# Patient Record
Sex: Female | Born: 1970
Health system: Southern US, Community
[De-identification: ages and names within clinical notes are randomized; demographics above are authoritative.]

## PROBLEM LIST (undated history)

## (undated) DIAGNOSIS — Z9229 Personal history of other drug therapy: Secondary | ICD-10-CM

## (undated) DIAGNOSIS — N83209 Unspecified ovarian cyst, unspecified side: Secondary | ICD-10-CM

## (undated) DIAGNOSIS — E785 Hyperlipidemia, unspecified: Secondary | ICD-10-CM

## (undated) DIAGNOSIS — K7689 Other specified diseases of liver: Secondary | ICD-10-CM

## (undated) HISTORY — PX: ABDOMINAL HYSTERECTOMY: SHX81

## (undated) HISTORY — DX: Personal history of other drug therapy: Z92.29

## (undated) HISTORY — PX: OVARIAN CYST REMOVAL: SHX89

## (undated) HISTORY — DX: Unspecified ovarian cyst, unspecified side: N83.209

---

## 1994-03-27 DIAGNOSIS — E785 Hyperlipidemia, unspecified: Secondary | ICD-10-CM

## 1994-03-27 HISTORY — DX: Hyperlipidemia, unspecified: E78.5

## 1998-03-27 HISTORY — PX: BREAST SURGERY: SHX581

## 2002-03-27 HISTORY — PX: TUBAL LIGATION: SHX77

## 2010-03-27 DIAGNOSIS — Z9229 Personal history of other drug therapy: Secondary | ICD-10-CM

## 2010-03-27 HISTORY — DX: Personal history of other drug therapy: Z92.29

## 2012-01-20 ENCOUNTER — Encounter (HOSPITAL_COMMUNITY): Payer: Self-pay | Admitting: Physical Medicine and Rehabilitation

## 2012-01-20 ENCOUNTER — Emergency Department (HOSPITAL_COMMUNITY)
Admission: EM | Admit: 2012-01-20 | Discharge: 2012-01-20 | Disposition: A | Discharge status: Home / Self Care | Payer: Self-pay | Attending: Emergency Medicine | Admitting: Emergency Medicine

## 2012-01-20 DIAGNOSIS — T148XXA Other injury of unspecified body region, initial encounter: Secondary | ICD-10-CM | POA: Insufficient documentation

## 2012-01-20 DIAGNOSIS — Y990 Civilian activity done for income or pay: Secondary | ICD-10-CM | POA: Insufficient documentation

## 2012-01-20 DIAGNOSIS — X500XXA Overexertion from strenuous movement or load, initial encounter: Secondary | ICD-10-CM | POA: Insufficient documentation

## 2012-01-20 DIAGNOSIS — Y9389 Activity, other specified: Secondary | ICD-10-CM | POA: Insufficient documentation

## 2012-01-20 DIAGNOSIS — Y9269 Other specified industrial and construction area as the place of occurrence of the external cause: Secondary | ICD-10-CM | POA: Insufficient documentation

## 2012-01-20 DIAGNOSIS — M25519 Pain in unspecified shoulder: Secondary | ICD-10-CM | POA: Insufficient documentation

## 2012-01-20 DIAGNOSIS — E785 Hyperlipidemia, unspecified: Secondary | ICD-10-CM | POA: Insufficient documentation

## 2012-01-20 HISTORY — DX: Hyperlipidemia, unspecified: E78.5

## 2012-01-20 MED ORDER — CYCLOBENZAPRINE HCL 10 MG PO TABS: 10.0000 mg | ORAL_TABLET | Freq: Three times a day (TID) | ORAL | Status: DC | PRN

## 2012-01-20 MED ORDER — DIAZEPAM 5 MG PO TABS
5.0000 mg | ORAL_TABLET | Freq: Once | ORAL | Status: AC
  Administered 2012-01-20: 5 mg via ORAL
  Filled 2012-01-20: qty 1

## 2012-01-20 MED ORDER — KETOROLAC TROMETHAMINE 60 MG/2ML IM SOLN
60.0000 mg | Freq: Once | INTRAMUSCULAR | Status: AC
  Administered 2012-01-20: 60 mg via INTRAMUSCULAR
  Filled 2012-01-20: qty 2

## 2012-01-20 MED ORDER — IBUPROFEN 800 MG PO TABS: 800.0000 mg | ORAL_TABLET | Freq: Three times a day (TID) | ORAL | Status: DC

## 2012-01-20 NOTE — ED Notes (Signed)
Pt presents to department for evaluation of L shoulder, back and neck pain. Ongoing x3 days. States "I think I pulled a muscle at work." States she lifts heavy objects frequently. 7/10 pain, becomes worse with movement. Full ROM noted. She is alert and oriented x4.

## 2012-01-20 NOTE — ED Notes (Signed)
Pt states the pain began three days ago on her upper left back and shoulder area and he her neck. She has tried taken aleve and a bengay patch but nothing has worked. States pain is a 9/10.

## 2012-01-20 NOTE — ED Provider Notes (Signed)
History     CSN: [id number]  Arrival date & time 01/20/12  4098   First MD Initiated Contact with Patient 01/20/12 1004      Chief Complaint  Patient presents with  . Back Pain  . Shoulder Pain    (Consider location/radiation/quality/duration/timing/severity/associated sxs/prior treatment) HPI Comments: Patient is a 41 year old female who presents with a 3 days history of left shoulder, upper left back and neck pain. The pain started gradually and progressively worsened since the onset. 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 The pain is described as a "soreness" that is moderate in severity and made worse with movement. Patient has tried heat for pain without significant relief. No alleviating factors. Patient denies weakness, joint swelling, numbness/tingling, coolness, color changes of affected extremities. Patient denies fever, chills, NVD, chest pain, SOB, abdominal pain, low back pain.    Past Medical History  Diagnosis Date  . Hyperlipemia     No past surgical history on file.  No family history on file.  History  Substance Use Topics  . Smoking status: Never Smoker   . Smokeless tobacco: Not on file  . Alcohol Use: No    OB History    Grav Para Term Preterm Abortions TAB SAB Ect Mult Living                  Review of Systems  Musculoskeletal: Positive for back pain and arthralgias.  All other systems reviewed and are negative.    Allergies  Review of patient's allergies indicates no known allergies.  Home Medications   Current Outpatient Rx  Name Route Sig Dispense Refill  . NAPROXEN SODIUM 220 MG PO TABS Oral Take 440-660 mg by mouth 2 (two) times daily as needed. For pain    . CYCLOBENZAPRINE HCL 10 MG PO TABS Oral Take 1 tablet (10 mg total) by mouth 3 (three) times daily as needed for muscle spasms. 30 tablet 0  . IBUPROFEN 800 MG PO TABS Oral Take 1 tablet (800 mg total) by mouth 3 (three) times daily. 21  tablet 0    BP 115/52  Pulse 63  Temp 98 F (36.7 C) (Oral)  Resp 18  SpO2 100%  Physical Exam  Nursing note and vitals reviewed. Constitutional: She is oriented to person, place, and time. She appears well-developed and well-nourished. No distress.  HENT:  Head: Normocephalic and atraumatic.  Eyes: Conjunctivae normal are normal.  Neck: Normal range of motion. Neck supple.       See musculoskeletal  Cardiovascular: Normal rate, regular rhythm and intact distal pulses.  Exam reveals no gallop and no friction rub.   No murmur heard.      Sufficient capillary refill of distal upper extremities.   Pulmonary/Chest: Effort normal and breath sounds normal. She has no wheezes. She has no rales. She exhibits no tenderness.  Abdominal: Soft. She exhibits no distension.  Musculoskeletal: Normal range of motion.       Left deltoid, left lateral neck and left scapular area tender to palpation. Pain elicited in those areas with movement of the left shoulder in all direction. No bruising, swelling, or obvious deformity noted.   Neurological: She is alert and oriented to person, place, and time. Coordination normal.       Strength and sensation equal and intact of upper extremities. Speech is goal-oriented. Moves limbs without ataxia.   Skin: Skin is warm and dry. She is not diaphoretic.  No bruising noted.   Psychiatric: She has a normal mood and affect. Her behavior is normal.    ED Course  Procedures (including critical care time)  Labs Reviewed - No data to display No results found.   1. Muscle strain       MDM  Patient likely has musculoskeletal pain related to overuse. She moves heavy objects at work. No localized bony tenderness. No need for imaging.         Emilia Beck, [place] 01/31/12 [phone]

## 2012-01-20 NOTE — ED Notes (Signed)
Discharge instructions reviewed. Pt verbalized understanding.  

## 2012-02-03 NOTE — ED Provider Notes (Signed)
Medical screening examination/treatment/procedure(s) were performed by non-physician practitioner and as supervising physician I was immediately available for consultation/collaboration.   Gerhard Munch, MD 02/03/12 [phone]

## 2012-03-10 ENCOUNTER — Emergency Department (HOSPITAL_BASED_OUTPATIENT_CLINIC_OR_DEPARTMENT_OTHER): Payer: Self-pay

## 2012-03-10 ENCOUNTER — Emergency Department (HOSPITAL_BASED_OUTPATIENT_CLINIC_OR_DEPARTMENT_OTHER)
Admission: EM | Admit: 2012-03-10 | Discharge: 2012-03-10 | Disposition: A | Discharge status: Home / Self Care | Payer: Self-pay | Attending: Emergency Medicine | Admitting: Emergency Medicine

## 2012-03-10 ENCOUNTER — Encounter (HOSPITAL_BASED_OUTPATIENT_CLINIC_OR_DEPARTMENT_OTHER): Payer: Self-pay | Admitting: *Deleted

## 2012-03-10 DIAGNOSIS — Z862 Personal history of diseases of the blood and blood-forming organs and certain disorders involving the immune mechanism: Secondary | ICD-10-CM | POA: Insufficient documentation

## 2012-03-10 DIAGNOSIS — M549 Dorsalgia, unspecified: Secondary | ICD-10-CM

## 2012-03-10 DIAGNOSIS — Z791 Long term (current) use of non-steroidal anti-inflammatories (NSAID): Secondary | ICD-10-CM | POA: Insufficient documentation

## 2012-03-10 DIAGNOSIS — Z79899 Other long term (current) drug therapy: Secondary | ICD-10-CM | POA: Insufficient documentation

## 2012-03-10 DIAGNOSIS — IMO0001 Reserved for inherently not codable concepts without codable children: Secondary | ICD-10-CM | POA: Insufficient documentation

## 2012-03-10 DIAGNOSIS — M545 Low back pain, unspecified: Secondary | ICD-10-CM | POA: Insufficient documentation

## 2012-03-10 DIAGNOSIS — Z8639 Personal history of other endocrine, nutritional and metabolic disease: Secondary | ICD-10-CM | POA: Insufficient documentation

## 2012-03-10 DIAGNOSIS — Z3202 Encounter for pregnancy test, result negative: Secondary | ICD-10-CM | POA: Insufficient documentation

## 2012-03-10 LAB — URINE MICROSCOPIC-ADD ON

## 2012-03-10 LAB — URINALYSIS, ROUTINE W REFLEX MICROSCOPIC
Leukocytes, UA: NEGATIVE
Nitrite: NEGATIVE
Specific Gravity, Urine: 1.015 (ref 1.005–1.030)
Urobilinogen, UA: 0.2 mg/dL (ref 0.0–1.0)
pH: 5 (ref 5.0–8.0)

## 2012-03-10 MED ORDER — CYCLOBENZAPRINE HCL 10 MG PO TABS
10.0000 mg | ORAL_TABLET | Freq: Once | ORAL | Status: AC
  Administered 2012-03-10: 10 mg via ORAL
  Filled 2012-03-10: qty 1

## 2012-03-10 MED ORDER — CYCLOBENZAPRINE HCL 10 MG PO TABS: 10.0000 mg | ORAL_TABLET | Freq: Two times a day (BID) | ORAL | Status: DC | PRN

## 2012-03-10 MED ORDER — HYDROCODONE-ACETAMINOPHEN 5-500 MG PO TABS: 1.0000 | ORAL_TABLET | Freq: Four times a day (QID) | ORAL | Status: DC | PRN

## 2012-03-10 MED ORDER — OXYCODONE-ACETAMINOPHEN 5-325 MG PO TABS
2.0000 | ORAL_TABLET | Freq: Once | ORAL | Status: AC
  Administered 2012-03-10: 2 via ORAL
  Filled 2012-03-10 (×2): qty 2

## 2012-03-10 NOTE — ED Notes (Signed)
Pt states she bent down and has been hurting in her lower back for about a week. Hurts to walk, bend, move, etc

## 2012-03-10 NOTE — ED Provider Notes (Signed)
History     CSN: [id number]  Arrival date & time 03/10/12  1503   First MD Initiated Contact with Patient 03/10/12 1714      Chief Complaint  Patient presents with  . Back Pain    (Consider location/radiation/quality/duration/timing/severity/associated sxs/prior treatment) HPI Comments: Patient presents with back pain X 1 week. She states the pain is in her lower back with some radiation down her right leg. Pain is worse with bending and movement. Of note patient has had similar back pain and seen in ED for same. Has tried Bengay patches with minimal improvement. Denies fever or chills. Denies NVD or abdominal pain. Denies dysuria, frequency, or abdominal pain.   The history is provided by the patient. No language interpreter was used.    Past Medical History  Diagnosis Date  . Hyperlipemia     Past Surgical History  Procedure Date  . Breast surgery   . Ovarian cyst removal     No family history on file.  History  Substance Use Topics  . Smoking status: Never Smoker   . Smokeless tobacco: Not on file  . Alcohol Use: No    OB History    Grav Para Term Preterm Abortions TAB SAB Ect Mult Living                  Review of Systems  Constitutional: Negative for fever and chills.  Gastrointestinal: Negative for nausea, vomiting, abdominal pain and diarrhea.  Genitourinary: Negative for dysuria, urgency, frequency and difficulty urinating.  Musculoskeletal: Positive for myalgias and back pain.    Allergies  Review of patient's allergies indicates no known allergies.  Home Medications   Current Outpatient Rx  Name  Route  Sig  Dispense  Refill  . CYCLOBENZAPRINE HCL 10 MG PO TABS   Oral   Take 1 tablet (10 mg total) by mouth 3 (three) times daily as needed for muscle spasms.   30 tablet   0   . IBUPROFEN 800 MG PO TABS   Oral   Take 1 tablet (800 mg total) by mouth 3 (three) times daily.   21 tablet   0   . NAPROXEN SODIUM 220 MG PO TABS   Oral   Take  440-660 mg by mouth 2 (two) times daily as needed. For pain           BP 119/62  Pulse 84  Temp 98.8 F (37.1 C) (Oral)  Resp 16  Ht 5\' 7"  (1.702 m)  Wt 145 lb (65.772 kg)  BMI 22.71 kg/m2  SpO2 100%  LMP 02/19/2012  Physical Exam  Nursing note and vitals reviewed. Constitutional: She is oriented to person, place, and time. She appears well-developed and well-nourished.  HENT:  Head: Normocephalic and atraumatic.  Mouth/Throat: Oropharynx is clear and moist.  Eyes: Conjunctivae normal and EOM are normal. No scleral icterus.  Neck: Normal range of motion. Neck supple.  Cardiovascular: Normal rate, regular rhythm and normal heart sounds.   Pulmonary/Chest: Effort normal and breath sounds normal.  Abdominal: Soft. Bowel sounds are normal. There is no tenderness.  Musculoskeletal: Normal range of motion. She exhibits tenderness. She exhibits no edema.       Pain with flexion. Tenderness to palpation of the right lumbosacral paraspinal muscles.   Neurological: She is alert and oriented to person, place, and time. She has normal reflexes. She displays normal reflexes. She exhibits normal muscle tone. Coordination normal.  Skin: Skin is warm and dry.    ED  Course  Procedures (including critical care time)  Labs Reviewed  URINALYSIS, ROUTINE W REFLEX MICROSCOPIC - Abnormal; Notable for the following:    APPearance CLOUDY (*)     Hgb urine dipstick TRACE (*)     All other components within normal limits  PREGNANCY, URINE  URINE MICROSCOPIC-ADD ON   Results for orders placed during the hospital encounter of 03/10/12  URINALYSIS, ROUTINE W REFLEX MICROSCOPIC      Component Value Range   Color, Urine YELLOW  YELLOW   APPearance CLOUDY (*) CLEAR   Specific Gravity, Urine 1.015  1.005 - 1.030   pH 5.0  5.0 - 8.0   Glucose, UA NEGATIVE  NEGATIVE mg/dL   Hgb urine dipstick TRACE (*) NEGATIVE   Bilirubin Urine NEGATIVE  NEGATIVE   Ketones, ur NEGATIVE  NEGATIVE mg/dL   Protein,  ur NEGATIVE  NEGATIVE mg/dL   Urobilinogen, UA 0.2  0.0 - 1.0 mg/dL   Nitrite NEGATIVE  NEGATIVE   Leukocytes, UA NEGATIVE  NEGATIVE  PREGNANCY, URINE      Component Value Range   Preg Test, Ur NEGATIVE  NEGATIVE  URINE MICROSCOPIC-ADD ON      Component Value Range   RBC / HPF 0-2  <3 RBC/hpf    Dg Lumbar Spine Complete  03/10/2012  *RADIOLOGY REPORT*  Clinical Data: Low back pain  LUMBAR SPINE - COMPLETE 4+ VIEW  Comparison: None.  Findings: Five lumbar-type vertebral bodies.  Normal lumbar lordosis.  No evidence of fracture or dislocation.  Vertebral body heights and intervertebral disc spaces are maintained.  Visualized bony pelvis appears intact.  IMPRESSION: Normal lumbar spine radiographs.   Original Report Authenticated By: Charline Bills, M.D.      1. Back pain       MDM  Patient presented with complaint of low back pain. Imaging unremarkable. Given percocet and flexeril with some improvement. Discharged on same. Referred to orthopedic specialist for MRI if desired. No neuro or strength deficits. Discharged with return precautions. No red flags for cauda equina.       Pixie Casino, PA-C 03/10/12 1922  Pixie Casino, [place] 03/10/12 [phone]

## 2012-03-11 NOTE — ED Provider Notes (Signed)
Medical screening examination/treatment/procedure(s) were performed by non-physician practitioner and as supervising physician I was immediately available for consultation/collaboration.  Shelda Jakes, MD 03/11/12 1758

## 2012-08-06 ENCOUNTER — Encounter (HOSPITAL_BASED_OUTPATIENT_CLINIC_OR_DEPARTMENT_OTHER): Payer: Self-pay

## 2012-08-06 ENCOUNTER — Emergency Department (HOSPITAL_BASED_OUTPATIENT_CLINIC_OR_DEPARTMENT_OTHER)
Admission: EM | Admit: 2012-08-06 | Discharge: 2012-08-06 | Disposition: A | Discharge status: Home / Self Care | Payer: Worker's Compensation | Attending: Emergency Medicine | Admitting: Emergency Medicine

## 2012-08-06 DIAGNOSIS — W260XXA Contact with knife, initial encounter: Secondary | ICD-10-CM | POA: Insufficient documentation

## 2012-08-06 DIAGNOSIS — S61012A Laceration without foreign body of left thumb without damage to nail, initial encounter: Secondary | ICD-10-CM

## 2012-08-06 DIAGNOSIS — Y9289 Other specified places as the place of occurrence of the external cause: Secondary | ICD-10-CM | POA: Insufficient documentation

## 2012-08-06 DIAGNOSIS — Z862 Personal history of diseases of the blood and blood-forming organs and certain disorders involving the immune mechanism: Secondary | ICD-10-CM | POA: Insufficient documentation

## 2012-08-06 DIAGNOSIS — Z8639 Personal history of other endocrine, nutritional and metabolic disease: Secondary | ICD-10-CM | POA: Insufficient documentation

## 2012-08-06 DIAGNOSIS — W261XXA Contact with sword or dagger, initial encounter: Secondary | ICD-10-CM | POA: Insufficient documentation

## 2012-08-06 DIAGNOSIS — Y990 Civilian activity done for income or pay: Secondary | ICD-10-CM | POA: Insufficient documentation

## 2012-08-06 DIAGNOSIS — S61209A Unspecified open wound of unspecified finger without damage to nail, initial encounter: Secondary | ICD-10-CM | POA: Insufficient documentation

## 2012-08-06 DIAGNOSIS — Z79899 Other long term (current) drug therapy: Secondary | ICD-10-CM | POA: Insufficient documentation

## 2012-08-06 DIAGNOSIS — Y9389 Activity, other specified: Secondary | ICD-10-CM | POA: Insufficient documentation

## 2012-08-06 MED ORDER — LIDOCAINE HCL 2 % IJ SOLN
INTRAMUSCULAR | Status: AC
  Administered 2012-08-06: 11:00:00
  Filled 2012-08-06: qty 20

## 2012-08-06 MED ORDER — OXYCODONE-ACETAMINOPHEN 5-325 MG PO TABS: 2.0000 | ORAL_TABLET | ORAL | Status: DC | PRN

## 2012-08-06 NOTE — ED Notes (Signed)
Laceration to left thumb

## 2012-08-06 NOTE — ED Notes (Signed)
Pt was using a "razor knife" at work cut left thumb bleeding contolled on arrival

## 2012-08-06 NOTE — ED Notes (Signed)
MD at bedside. 

## 2012-08-06 NOTE — ED Provider Notes (Signed)
History     CSN: [id number]  Arrival date & time 08/06/12  1012   First MD Initiated Contact with Patient 08/06/12 1025      Chief Complaint  Patient presents with  . Laceration    (Consider location/radiation/quality/duration/timing/severity/associated sxs/prior treatment) HPI Comments: Cut left thumb with a utility knife at work.  Patient is a 42 y.o. female presenting with skin laceration. The history is provided by the patient.  Laceration Location: left thumb. Depth:  Through underlying tissue Bleeding: controlled   Time since incident:  1 hour Laceration mechanism:  Knife Pain details:    Quality:  Aching   Severity:  Severe   Timing:  Constant   Progression:  Worsening Foreign body present:  No foreign bodies Relieved by:  Nothing Worsened by:  Nothing tried Ineffective treatments:  None tried Tetanus status:  Up to date   Past Medical History  Diagnosis Date  . Hyperlipemia     Past Surgical History  Procedure Laterality Date  . Breast surgery    . Ovarian cyst removal      No family history on file.  History  Substance Use Topics  . Smoking status: Never Smoker   . Smokeless tobacco: Not on file  . Alcohol Use: No    OB History   Grav Para Term Preterm Abortions TAB SAB Ect Mult Living                  Review of Systems  All other systems reviewed and are negative.    Allergies  Review of patient's allergies indicates no known allergies.  Home Medications   Current Outpatient Rx  Name  Route  Sig  Dispense  Refill  . cyclobenzaprine (FLEXERIL) 10 MG tablet   Oral   Take 1 tablet (10 mg total) by mouth 3 (three) times daily as needed for muscle spasms.   30 tablet   0   . cyclobenzaprine (FLEXERIL) 10 MG tablet   Oral   Take 1 tablet (10 mg total) by mouth 2 (two) times daily as needed for muscle spasms.   20 tablet   0   . HYDROcodone-acetaminophen (VICODIN) 5-500 MG per tablet   Oral   Take 1-2 tablets by mouth every 6  (six) hours as needed for pain.   15 tablet   0   . ibuprofen (ADVIL,MOTRIN) 800 MG tablet   Oral   Take 1 tablet (800 mg total) by mouth 3 (three) times daily.   21 tablet   0   . naproxen sodium (ANAPROX) 220 MG tablet   Oral   Take 440-660 mg by mouth 2 (two) times daily as needed. For pain           BP 130/77  Pulse 90  Temp(Src) 98.5 F (36.9 C) (Oral)  Resp 22  SpO2 99%  Physical Exam  Nursing note and vitals reviewed. Constitutional: She is oriented to person, place, and time. She appears well-developed and well-nourished.  HENT:  Head: Normocephalic and atraumatic.  Neck: Normal range of motion. Neck supple.  Musculoskeletal:  The left thumb is noted to have a laceration with longitudinal  flap to the medial aspect of the nail.  There is no nail involvement.    Neurological: She is alert and oriented to person, place, and time.  Skin: Skin is warm and dry.    ED Course  Procedures (including critical care time)  Labs Reviewed - No data to display No results found.  No diagnosis found.  LACERATION REPAIR Performed by: Geoffery Lyons Authorized by: Geoffery Lyons Consent: Verbal consent obtained. Risks and benefits: risks, benefits and alternatives were discussed Consent given by: patient Patient identity confirmed: provided demographic data Prepped and Draped in normal sterile fashion Wound explored  Laceration Location: left thumb  Laceration Length: 2.5 cm  No Foreign Bodies seen or palpated  Anesthesia: digital block  Local anesthetic: lidocaine 2% without epinephrine  Anesthetic total: 7 ml  Irrigation method: syringe Amount of cleaning: standard  Skin closure: 4-0 prolene  Number of sutures: 4  Technique: simple interrupted.  Patient tolerance: Patient tolerated the procedure well with no immediate complications.   MDM  Local wound care.  Sutures out in one week.          Geoffery Lyons, MD 08/06/12 1049

## 2013-10-09 ENCOUNTER — Observation Stay (HOSPITAL_BASED_OUTPATIENT_CLINIC_OR_DEPARTMENT_OTHER)
Admission: EM | Admit: 2013-10-09 | Discharge: 2013-10-11 | Disposition: A | Discharge status: Home / Self Care | Payer: Medicaid Other | Attending: Internal Medicine | Admitting: Internal Medicine

## 2013-10-09 ENCOUNTER — Observation Stay (HOSPITAL_COMMUNITY): Payer: Medicaid Other

## 2013-10-09 ENCOUNTER — Emergency Department (HOSPITAL_BASED_OUTPATIENT_CLINIC_OR_DEPARTMENT_OTHER): Payer: Medicaid Other

## 2013-10-09 ENCOUNTER — Encounter (HOSPITAL_BASED_OUTPATIENT_CLINIC_OR_DEPARTMENT_OTHER): Payer: Self-pay | Admitting: Emergency Medicine

## 2013-10-09 DIAGNOSIS — R079 Chest pain, unspecified: Principal | ICD-10-CM | POA: Insufficient documentation

## 2013-10-09 DIAGNOSIS — E785 Hyperlipidemia, unspecified: Secondary | ICD-10-CM | POA: Insufficient documentation

## 2013-10-09 DIAGNOSIS — T7840XA Allergy, unspecified, initial encounter: Secondary | ICD-10-CM

## 2013-10-09 DIAGNOSIS — R0602 Shortness of breath: Secondary | ICD-10-CM

## 2013-10-09 DIAGNOSIS — R002 Palpitations: Secondary | ICD-10-CM | POA: Diagnosis present

## 2013-10-09 DIAGNOSIS — K769 Liver disease, unspecified: Secondary | ICD-10-CM | POA: Diagnosis present

## 2013-10-09 DIAGNOSIS — R220 Localized swelling, mass and lump, head: Secondary | ICD-10-CM

## 2013-10-09 DIAGNOSIS — R0789 Other chest pain: Secondary | ICD-10-CM

## 2013-10-09 DIAGNOSIS — T7840XD Allergy, unspecified, subsequent encounter: Secondary | ICD-10-CM

## 2013-10-09 LAB — CBC WITH DIFFERENTIAL/PLATELET
Basophils Absolute: 0 10*3/uL (ref 0.0–0.1)
Basophils Relative: 0 % (ref 0–1)
EOS ABS: 1.8 10*3/uL — AB (ref 0.0–0.7)
Eosinophils Relative: 21 % — ABNORMAL HIGH (ref 0–5)
HEMATOCRIT: 32.7 % — AB (ref 36.0–46.0)
HEMOGLOBIN: 10.2 g/dL — AB (ref 12.0–15.0)
LYMPHS ABS: 3 10*3/uL (ref 0.7–4.0)
Lymphocytes Relative: 35 % (ref 12–46)
MCH: 23.2 pg — AB (ref 26.0–34.0)
MCHC: 31.2 g/dL (ref 30.0–36.0)
MCV: 74.3 fL — AB (ref 78.0–100.0)
MONOS PCT: 6 % (ref 3–12)
Monocytes Absolute: 0.5 10*3/uL (ref 0.1–1.0)
NEUTROS PCT: 38 % — AB (ref 43–77)
Neutro Abs: 3.3 10*3/uL (ref 1.7–7.7)
Platelets: 289 10*3/uL (ref 150–400)
RBC: 4.4 MIL/uL (ref 3.87–5.11)
RDW: 16.9 % — ABNORMAL HIGH (ref 11.5–15.5)
WBC: 8.7 10*3/uL (ref 4.0–10.5)

## 2013-10-09 LAB — BASIC METABOLIC PANEL
Anion gap: 13 (ref 5–15)
BUN: 9 mg/dL (ref 6–23)
CHLORIDE: 105 meq/L (ref 96–112)
CO2: 22 mEq/L (ref 19–32)
Calcium: 9.1 mg/dL (ref 8.4–10.5)
Creatinine, Ser: 0.6 mg/dL (ref 0.50–1.10)
GFR calc Af Amer: >90 mL/min (ref >90)
GLUCOSE: 106 mg/dL — AB (ref 70–99)
POTASSIUM: 4 meq/L (ref 3.7–5.3)
Sodium: 140 mEq/L (ref 137–147)

## 2013-10-09 LAB — LIPID PANEL
Cholesterol: 273 mg/dL — ABNORMAL HIGH (ref 0–200)
HDL: 35 mg/dL — ABNORMAL LOW (ref >39)
LDL CALC: 187 mg/dL — AB (ref 0–99)
TRIGLYCERIDES: 254 mg/dL — AB (ref <150)
Total CHOL/HDL Ratio: 7.8 RATIO
VLDL: 51 mg/dL — ABNORMAL HIGH (ref 0–40)

## 2013-10-09 LAB — HEPATIC FUNCTION PANEL
ALT: 12 U/L (ref 0–35)
AST: 18 U/L (ref 0–37)
Albumin: 3.9 g/dL (ref 3.5–5.2)
Alkaline Phosphatase: 75 U/L (ref 39–117)
Total Bilirubin: <0.2 mg/dL — ABNORMAL LOW (ref 0.3–1.2)
Total Protein: 7.5 g/dL (ref 6.0–8.3)

## 2013-10-09 LAB — CBC
HCT: 34.4 % — ABNORMAL LOW (ref 36.0–46.0)
HEMOGLOBIN: 10.5 g/dL — AB (ref 12.0–15.0)
MCH: 22.6 pg — ABNORMAL LOW (ref 26.0–34.0)
MCHC: 30.5 g/dL (ref 30.0–36.0)
MCV: 74 fL — ABNORMAL LOW (ref 78.0–100.0)
Platelets: 328 10*3/uL (ref 150–400)
RBC: 4.65 MIL/uL (ref 3.87–5.11)
RDW: 16.7 % — ABNORMAL HIGH (ref 11.5–15.5)
WBC: 9.1 10*3/uL (ref 4.0–10.5)

## 2013-10-09 LAB — LIPASE, BLOOD: Lipase: 29 U/L (ref 11–59)

## 2013-10-09 LAB — CREATININE, SERUM
CREATININE: 0.57 mg/dL (ref 0.50–1.10)
GFR calc Af Amer: >90 mL/min (ref >90)

## 2013-10-09 LAB — IRON AND TIBC
IRON: 17 ug/dL — AB (ref 42–135)
Saturation Ratios: 4 % — ABNORMAL LOW (ref 20–55)
TIBC: 457 ug/dL (ref 250–470)
UIBC: 440 ug/dL — ABNORMAL HIGH (ref 125–400)

## 2013-10-09 LAB — TSH: TSH: 2.63 u[IU]/mL (ref 0.350–4.500)

## 2013-10-09 LAB — FOLATE: FOLATE: 13.8 ng/mL

## 2013-10-09 LAB — TROPONIN I: Troponin I: <0.3 ng/mL (ref <0.30)

## 2013-10-09 LAB — FERRITIN: FERRITIN: 7 ng/mL — AB (ref 10–291)

## 2013-10-09 LAB — RETICULOCYTES
RBC.: 4.64 MIL/uL (ref 3.87–5.11)
RETIC COUNT ABSOLUTE: 37.1 10*3/uL (ref 19.0–186.0)
Retic Ct Pct: 0.8 % (ref 0.4–3.1)

## 2013-10-09 LAB — VITAMIN B12: Vitamin B-12: 435 pg/mL (ref 211–911)

## 2013-10-09 LAB — AMYLASE: Amylase: 44 U/L (ref 0–105)

## 2013-10-09 LAB — MRSA PCR SCREENING: MRSA by PCR: NEGATIVE

## 2013-10-09 LAB — MAGNESIUM: MAGNESIUM: 2.1 mg/dL (ref 1.5–2.5)

## 2013-10-09 LAB — PHOSPHORUS: Phosphorus: 3.3 mg/dL (ref 2.3–4.6)

## 2013-10-09 MED ORDER — MORPHINE SULFATE 2 MG/ML IJ SOLN
2.0000 mg | INTRAMUSCULAR | Status: DC | PRN
  Administered 2013-10-09: 2 mg via INTRAVENOUS
  Filled 2013-10-09: qty 1

## 2013-10-09 MED ORDER — ASPIRIN EC 325 MG PO TBEC
325.0000 mg | DELAYED_RELEASE_TABLET | Freq: Every day | ORAL | Status: DC
  Administered 2013-10-09 – 2013-10-11 (×3): 325 mg via ORAL
  Filled 2013-10-09 (×3): qty 1

## 2013-10-09 MED ORDER — HEPARIN SODIUM (PORCINE) 5000 UNIT/ML IJ SOLN
5000.0000 [IU] | Freq: Three times a day (TID) | INTRAMUSCULAR | Status: DC
  Administered 2013-10-09 – 2013-10-11 (×5): 5000 [IU] via SUBCUTANEOUS
  Filled 2013-10-09 (×9): qty 1

## 2013-10-09 MED ORDER — MORPHINE SULFATE 4 MG/ML IJ SOLN
4.0000 mg | Freq: Once | INTRAMUSCULAR | Status: AC
  Administered 2013-10-09: 4 mg via INTRAVENOUS
  Filled 2013-10-09: qty 1

## 2013-10-09 MED ORDER — ASPIRIN 81 MG PO CHEW
162.0000 mg | CHEWABLE_TABLET | Freq: Once | ORAL | Status: AC
  Administered 2013-10-09: 162 mg via ORAL
  Filled 2013-10-09: qty 2

## 2013-10-09 MED ORDER — ONDANSETRON HCL 4 MG/2ML IJ SOLN
4.0000 mg | Freq: Four times a day (QID) | INTRAMUSCULAR | Status: DC | PRN
  Administered 2013-10-10: 4 mg via INTRAVENOUS
  Filled 2013-10-09: qty 2

## 2013-10-09 MED ORDER — PANTOPRAZOLE SODIUM 40 MG PO TBEC
40.0000 mg | DELAYED_RELEASE_TABLET | Freq: Every day | ORAL | Status: DC
  Administered 2013-10-09 – 2013-10-11 (×3): 40 mg via ORAL
  Filled 2013-10-09 (×3): qty 1

## 2013-10-09 MED ORDER — NITROGLYCERIN 0.4 MG SL SUBL
0.4000 mg | SUBLINGUAL_TABLET | SUBLINGUAL | Status: DC | PRN
  Administered 2013-10-09: 0.4 mg via SUBLINGUAL
  Filled 2013-10-09: qty 1

## 2013-10-09 MED ORDER — ACETAMINOPHEN 325 MG PO TABS
650.0000 mg | ORAL_TABLET | ORAL | Status: DC | PRN
  Administered 2013-10-10 (×2): 650 mg via ORAL
  Filled 2013-10-09 (×2): qty 2

## 2013-10-09 MED ORDER — CYCLOBENZAPRINE HCL 10 MG PO TABS
10.0000 mg | ORAL_TABLET | Freq: Three times a day (TID) | ORAL | Status: DC | PRN
  Administered 2013-10-09: 10 mg via ORAL
  Filled 2013-10-09 (×2): qty 1

## 2013-10-09 NOTE — ED Notes (Signed)
Doug called back with Truck dispatched to carry patient to [facility]

## 2013-10-09 NOTE — ED Notes (Signed)
Pt reports chest tightness, (L) arm heaviness and pain and SOB, nausea-denies vomiting.

## 2013-10-09 NOTE — ED Provider Notes (Addendum)
CSN: [id number]     Arrival date & time 10/09/13  0449 History   First MD Initiated Contact with Patient 10/09/13 0505     Chief Complaint  Patient presents with  . Chest Pain     (Consider location/radiation/quality/duration/timing/severity/associated sxs/prior Treatment) HPI This is a 43 year old female with a history of hyperlipidemia as well as a family history of hyperlipidemia and heart attack and her maternal grandmother at age 2. She developed a vague heavy feeling in her left arm extending from her left shoulder down beginning about 8 PM yesterday evening. She describes as heavy feeling as being moderate in severity it does not change with movement of the arm. About 11 PM she developed a tightness in her chest along with shortness of breath. She describes this tightness as about an 8/10. It is not exacerbated or mitigated by anything. She has had some transient nausea but no vomiting. She has not been diaphoretic. She has also had the sensation that her legs were "loose" since yesterday afternoon about 4 PM. She denies pain or swelling in her legs. She took 2 x 81 mg aspirin yesterday evening with no change.   Past Medical History  Diagnosis Date  . Hyperlipemia    Past Surgical History  Procedure Laterality Date  . Breast surgery    . Ovarian cyst removal     History reviewed. No pertinent family history. History  Substance Use Topics  . Smoking status: Never Smoker   . Smokeless tobacco: Not on file  . Alcohol Use: No   OB History   Grav Para Term Preterm Abortions TAB SAB Ect Mult Living                 Review of Systems  All other systems reviewed and are negative.   Allergies  Review of patient's allergies indicates no known allergies.  Home Medications   Prior to Admission medications   Medication Sig Start Date End Date Taking? Authorizing Provider  cyclobenzaprine (FLEXERIL) 10 MG tablet Take 1 tablet (10 mg total) by mouth 3 (three) times daily as  needed for muscle spasms. 01/20/12   Kaitlyn Szekalski, PA-C  cyclobenzaprine (FLEXERIL) 10 MG tablet Take 1 tablet (10 mg total) by mouth 2 (two) times daily as needed for muscle spasms. 03/10/12   Annalee Genta, PA-C  HYDROcodone-acetaminophen (VICODIN) 5-500 MG per tablet Take 1-2 tablets by mouth every 6 (six) hours as needed for pain. 03/10/12   Annalee Genta, PA-C  ibuprofen (ADVIL,MOTRIN) 800 MG tablet Take 1 tablet (800 mg total) by mouth 3 (three) times daily. 01/20/12   Kaitlyn Szekalski, PA-C  naproxen sodium (ANAPROX) 220 MG tablet Take 440-660 mg by mouth 2 (two) times daily as needed. For pain    Historical Provider, MD   BP 121/79  Temp(Src) 98.1 F (36.7 C) (Oral)  Resp 18  Ht 5\' 7"  (1.702 m)  Wt 155 lb (70.308 kg)  BMI 24.27 kg/m2  SpO2 100%  Physical Exam General: Well-developed, well-nourished female in no acute distress; appearance consistent with age of record HENT: normocephalic; atraumatic Eyes: pupils equal, round and reactive to light; extraocular muscles intact Neck: supple Heart: regular rate and rhythm; no murmurs, rubs or gallops Lungs: clear to auscultation bilaterally Abdomen: soft; nondistended; nontender; no masses or hepatosplenomegaly; bowel sounds present Extremities: No deformity; full range of motion; pulses normal; no pain on range of motion of left shoulder Neurologic: Awake, alert and oriented; motor function intact in all extremities and symmetric; no facial droop  Skin: Warm and dry Psychiatric: Normal mood and affect    ED Course  Procedures (including critical care time)  MDM    EKG Interpretation  Date/Time:  Thursday October 09 2013 04:59:32 EDT Ventricular Rate:  72 PR Interval:  176 QRS Duration: 74 QT Interval:  394 QTC Calculation: 431 R Axis:   56 Text Interpretation:  Normal sinus rhythm Nonspecific T wave abnormality Abnormal ECG No previous ECGs available Confirmed by Captola Teschner  MD, Jenny Reichmann ([id number]) on 10/09/2013 5:05:10 AM       Nursing notes and vitals signs, including pulse oximetry, reviewed.  Summary of this visit's results, reviewed by myself:  Labs:  Results for orders placed during the hospital encounter of 10/09/13 (from the past 24 hour(s))  CBC WITH DIFFERENTIAL     Status: Abnormal   Collection Time    10/09/13  5:21 AM      Result Value Ref Range   WBC 8.7  4.0 - 10.5 K/uL   RBC 4.40  3.87 - 5.11 MIL/uL   Hemoglobin 10.2 (*) 12.0 - 15.0 g/dL   HCT 32.7 (*) 36.0 - 46.0 %   MCV 74.3 (*) 78.0 - 100.0 fL   MCH 23.2 (*) 26.0 - 34.0 pg   MCHC 31.2  30.0 - 36.0 g/dL   RDW 16.9 (*) 11.5 - 15.5 %   Platelets 289  150 - 400 K/uL   Neutrophils Relative % 38 (*) 43 - 77 %   Neutro Abs 3.3  1.7 - 7.7 K/uL   Lymphocytes Relative 35  12 - 46 %   Lymphs Abs 3.0  0.7 - 4.0 K/uL   Monocytes Relative 6  3 - 12 %   Monocytes Absolute 0.5  0.1 - 1.0 K/uL   Eosinophils Relative 21 (*) 0 - 5 %   Eosinophils Absolute 1.8 (*) 0.0 - 0.7 K/uL   Basophils Relative 0  0 - 1 %   Basophils Absolute 0.0  0.0 - 0.1 K/uL  BASIC METABOLIC PANEL     Status: Abnormal   Collection Time    10/09/13  5:21 AM      Result Value Ref Range   Sodium 140  137 - 147 mEq/L   Potassium 4.0  3.7 - 5.3 mEq/L   Chloride 105  96 - 112 mEq/L   CO2 22  19 - 32 mEq/L   Glucose, Bld 106 (*) 70 - 99 mg/dL   BUN 9  6 - 23 mg/dL   Creatinine, Ser 0.60  0.50 - 1.10 mg/dL   Calcium 9.1  8.4 - 10.5 mg/dL   GFR calc non Af Amer >90  >90 mL/min   GFR calc Af Amer >90  >90 mL/min   Anion gap 13  5 - 15  TROPONIN I     Status: None   Collection Time    10/09/13  5:21 AM      Result Value Ref Range   Troponin I <0.30  <0.30 ng/mL    Imaging Studies: Dg Chest 2 View  10/09/2013   CLINICAL DATA:  Left chest pain, shortness of breath.  Nonsmoker.  EXAM: CHEST  2 VIEW  COMPARISON:  None.  FINDINGS: Cardiomediastinal silhouette is unremarkable. The lungs are clear without pleural effusions or focal consolidations. Trachea projects midline and  there is no pneumothorax. Soft tissue planes and included osseous structures are non-suspicious. Multiple EKG lines overlie the patient and may obscure subtle underlying pathology.  IMPRESSION: No acute cardiopulmonary process.   Electronically  Signed   By: Elon Alas   On: 10/09/2013 05:40   5:54 AM No change with SL NTG.   6:46 AM Dr. Posey Pronto accepts for transfer to [facility].     Wynetta Fines, MD 10/09/13 [place], MD 10/09/13 [phone]

## 2013-10-09 NOTE — Consult Note (Signed)
Primary Physician: Primary Cardiologist:  New   HPI: Patient is a 43 yo with ahistory of hyperlipidiemia, FHx of CAD.  Admitted for L arm pain and chest pain  Yesterday afternoon cooking  Developed chest tightness, heaviness.  Felt like going to falll  SOB  Dizzy.  Felt heart racing.  Started 8 PM  Sat down  Then L arm arm aching.   Chest tightness never went away  8/10 in severity.  Breathing makes it tighter.  Took a couple aspirin and came in.     Patient does not SOB and occasional PND  No edema, Does not have regular doctor.  Chol not checked recnetly Patient does not a lot of reflux  She does have a lot of food intolerances.  Certain foods make her feel very bad.  Eggs, fatty foods.   Does get dizzy with rapid standing  Denies syncope.    Had same symptoms  but without arm discomfort about 1 year ago   Chest discomfort then lasted about 2 days.  [place] with maternal GM with CAD  Maternal side with high cholesterol.          Past Medical History  Diagnosis Date  . Hyperlipemia     Medications Prior to Admission  Medication Sig Dispense Refill  . cyclobenzaprine (FLEXERIL) 10 MG tablet Take 1 tablet (10 mg total) by mouth 3 (three) times daily as needed for muscle spasms.  30 tablet  0  . cyclobenzaprine (FLEXERIL) 10 MG tablet Take 1 tablet (10 mg total) by mouth 2 (two) times daily as needed for muscle spasms.  20 tablet  0  . HYDROcodone-acetaminophen (VICODIN) 5-500 MG per tablet Take 1-2 tablets by mouth every 6 (six) hours as needed for pain.  15 tablet  0  . ibuprofen (ADVIL,MOTRIN) 800 MG tablet Take 1 tablet (800 mg total) by mouth 3 (three) times daily.  21 tablet  0  . naproxen sodium (ANAPROX) 220 MG tablet Take 440-660 mg by mouth 2 (two) times daily as needed. For pain            Infusions:    No Known Allergies  History   Social History  . Marital Status: Married    Spouse Name: N/A    Number of Children: N/A  . Years of Education: N/A    Occupational History  . Not on file.   Social History Main Topics  . Smoking status: Never Smoker   . Smokeless tobacco: Not on file  . Alcohol Use: No  . Drug Use: No  . Sexual Activity: Yes    Birth Control/ Protection: None   Other Topics Concern  . Not on file   Social History Narrative  . No narrative on file    History reviewed. No pertinent family history.  REVIEW OF SYSTEMS:  All systems reviewed  Negative to the above problem except as noted above.    PHYSICAL EXAM: Filed Vitals:   10/09/13 1247  BP: 115/73  Pulse: 61  Temp: 98.5 F (36.9 C)  Resp: 18     Intake/Output Summary (Last 24 hours) at 10/09/13 1323 Last data filed at 10/09/13 1256  Gross per 24 hour  Intake      0 ml  Output   1000 ml  Net  -1000 ml    General:  Well appearing. No respiratory difficulty HEENT: normal Neck: supple. no JVD. Carotids 2+ bilat; no bruits. No lymphadenopathy or thryomegaly appreciated. Cor: PMI nondisplaced. Regular rate &  rhythm. No rubs, gallops or murmurs. Lungs: clear Abdomen: soft, nontender, nondistended. No hepatosplenomegaly. No bruits or masses. Good bowel sounds. Extremities: no cyanosis, clubbing, rash, edema Neuro: alert & oriented x 3, cranial nerves grossly intact. moves all 4 extremities w/o difficulty. Affect pleasant.  ECG:  SR 72 bpm.    Results for orders placed during the hospital encounter of 10/09/13 (from the past 24 hour(s))  CBC WITH DIFFERENTIAL     Status: Abnormal   Collection Time    10/09/13  5:21 AM      Result Value Ref Range   WBC 8.7  4.0 - 10.5 K/uL   RBC 4.40  3.87 - 5.11 MIL/uL   Hemoglobin 10.2 (*) 12.0 - 15.0 g/dL   HCT 32.7 (*) 36.0 - 46.0 %   MCV 74.3 (*) 78.0 - 100.0 fL   MCH 23.2 (*) 26.0 - 34.0 pg   MCHC 31.2  30.0 - 36.0 g/dL   RDW 16.9 (*) 11.5 - 15.5 %   Platelets 289  150 - 400 K/uL   Neutrophils Relative % 38 (*) 43 - 77 %   Neutro Abs 3.3  1.7 - 7.7 K/uL   Lymphocytes Relative 35  12 - 46 %    Lymphs Abs 3.0  0.7 - 4.0 K/uL   Monocytes Relative 6  3 - 12 %   Monocytes Absolute 0.5  0.1 - 1.0 K/uL   Eosinophils Relative 21 (*) 0 - 5 %   Eosinophils Absolute 1.8 (*) 0.0 - 0.7 K/uL   Basophils Relative 0  0 - 1 %   Basophils Absolute 0.0  0.0 - 0.1 K/uL  BASIC METABOLIC PANEL     Status: Abnormal   Collection Time    10/09/13  5:21 AM      Result Value Ref Range   Sodium 140  137 - 147 mEq/L   Potassium 4.0  3.7 - 5.3 mEq/L   Chloride 105  96 - 112 mEq/L   CO2 22  19 - 32 mEq/L   Glucose, Bld 106 (*) 70 - 99 mg/dL   BUN 9  6 - 23 mg/dL   Creatinine, Ser 0.60  0.50 - 1.10 mg/dL   Calcium 9.1  8.4 - 10.5 mg/dL   GFR calc non Af Amer >90  >90 mL/min   GFR calc Af Amer >90  >90 mL/min   Anion gap 13  5 - 15  TROPONIN I     Status: None   Collection Time    10/09/13  5:21 AM      Result Value Ref Range   Troponin I <0.30  <0.30 ng/mL   Dg Chest 2 View  10/09/2013   CLINICAL DATA:  Left chest pain, shortness of breath.  Nonsmoker.  EXAM: CHEST  2 VIEW  COMPARISON:  None.  FINDINGS: Cardiomediastinal silhouette is unremarkable. The lungs are clear without pleural effusions or focal consolidations. Trachea projects midline and there is no pneumothorax. Soft tissue planes and included osseous structures are non-suspicious. Multiple EKG lines overlie the patient and may obscure subtle underlying pathology.  IMPRESSION: No acute cardiopulmonary process.   Electronically Signed   By: Elon Alas   On: 10/09/2013 05:40     ASSESSMENT: Patient is a 43 yo who presents with CP and L arm pain.  Pain started yesterday and is severe and continuous.  EKG neg  Trop x 1 negative. Her history is signif for some SOB and also some food intolerances and reflux.  ?  If cp GI  Either reflux with esophageal spasm or problems with gallbladder.  Would recomm: 1.  Echo to evaluate LV systolic / diastolic function. 2  Check second troponin 3.  Check LFTs, amylase  Consider USN of GB.   4.   Empiric PPI 5.  Check lipids Overall I think it is OK for patient to eat.

## 2013-10-09 NOTE — H&P (Signed)
[facility] History and Physical  Alexandria Herrera XTG:[id number] DOB: 03/17/1971 DOA: 10/09/2013  Referring physician:  PCP: No primary provider on file.  Specialists:   Chief Complaint: Chest pain  HPI: Alexandria Herrera is a 43 y.o. female  With a history of hyperlipidemia that presented to [facility] for chest pain. Patient stated her chest pain cervical left side and radiated to her left arm as well as shoulder which started yesterday evening.  She describes it as a tightness and heaviness. Patient did complain of shortness of breath as well as dizziness associated with her chest pain and nausea. She stated that her pain was an 8/10 in severity. Nothing seems to make the pain better. Patient did try taking 2 baby aspirin as before going to the emergency department. Patient denied any heavy lifting, pulling or pushing.  She is an Conservation officer, nature.  She states she had chest pain one year ago, however, it subsided on it's own in 2 days.  She did not have the left arm and shoulder pain.  Patient admits to having hyperlipidemia however she discontinued her medications approximately 2 years ago. She also has a family history of coronary artery disease. She has several family members with high cholesterol. She currently states her chest pain has been ongoing and has not been alleviated.   Review of Systems:  Constitutional: Denies fever, chills, diaphoresis, appetite change and fatigue.  HEENT: Denies photophobia, eye pain, redness, hearing loss, ear pain, congestion, sore throat, rhinorrhea, sneezing, mouth sores, trouble swallowing, neck pain, neck stiffness and tinnitus.   Respiratory: Had shortness of breath with her chest pain.  Cardiovascular: Complains of chest pain. Gastrointestinal: Complains of nausea with her chest pain.  Denies abdominal pain, vomiting, constipation or diarrhea.  Genitourinary: Denies dysuria, urgency, frequency, hematuria, flank pain and difficulty  urinating.  Musculoskeletal: Denies myalgias, back pain, joint swelling, arthralgias and gait problem.  Skin: Denies pallor, rash and wound.  Neurological: Complains of dizziness with her chest pain. Hematological: Denies adenopathy. Easy bruising, personal or family bleeding history  Psychiatric/Behavioral: Denies suicidal ideation, mood changes, confusion, nervousness, sleep disturbance and agitation  Past Medical History  Diagnosis Date  . Hyperlipemia    Past Surgical History  Procedure Laterality Date  . Breast surgery    . Ovarian cyst removal     Social History:  reports that she has never smoked. She does not have any smokeless tobacco history on file. She reports that she does not drink alcohol or use illicit drugs. Patient work as an Conservation officer, nature.  No Known Allergies  History reviewed. No pertinent family history.  Prior to Admission medications   Medication Sig Start Date End Date Taking? Authorizing Provider  cyclobenzaprine (FLEXERIL) 10 MG tablet Take 1 tablet (10 mg total) by mouth 3 (three) times daily as needed for muscle spasms. 01/20/12   Kaitlyn Szekalski, PA-C  cyclobenzaprine (FLEXERIL) 10 MG tablet Take 1 tablet (10 mg total) by mouth 2 (two) times daily as needed for muscle spasms. 03/10/12   Annalee Genta, PA-C  HYDROcodone-acetaminophen (VICODIN) 5-500 MG per tablet Take 1-2 tablets by mouth every 6 (six) hours as needed for pain. 03/10/12   Annalee Genta, PA-C  ibuprofen (ADVIL,MOTRIN) 800 MG tablet Take 1 tablet (800 mg total) by mouth 3 (three) times daily. 01/20/12   Kaitlyn Szekalski, PA-C  naproxen sodium (ANAPROX) 220 MG tablet Take 440-660 mg by mouth 2 (two) times daily as needed. For pain    Historical Provider, MD  Physical Exam: Filed Vitals:   10/09/13 1247  BP: 115/73  Pulse: 61  Temp: 98.5 F (36.9 C)  Resp: 18     General: Well developed, well nourished, NAD, appears stated age  HEENT: NCAT, PERRLA, EOMI, Anicteic Sclera, mucous  membranes moist.   Neck: Supple, no JVD, no masses  Cardiovascular: S1 S2 auscultated, no rubs, murmurs or gallops. Regular rate and rhythm.  Respiratory: Clear to auscultation bilaterally with equal chest rise  Abdomen: Soft, nontender, nondistended, + bowel sounds  Extremities: warm dry without cyanosis clubbing or edema  Neuro: AAOx3, cranial nerves grossly intact. Strength 5/5 in patient's upper and lower extremities bilaterally  Skin: Without rashes exudates or nodules  Psych: Normal affect and demeanor with intact judgement and insight  Labs on Admission:  Basic Metabolic Panel:  Recent Labs Lab 10/09/13 0521  NA 140  K 4.0  CL 105  CO2 22  GLUCOSE 106*  BUN 9  CREATININE 0.60  CALCIUM 9.1   Liver Function Tests: No results found for this basename: AST, ALT, ALKPHOS, BILITOT, PROT, ALBUMIN,  in the last 168 hours No results found for this basename: LIPASE, AMYLASE,  in the last 168 hours No results found for this basename: AMMONIA,  in the last 168 hours CBC:  Recent Labs Lab 10/09/13 0521  WBC 8.7  NEUTROABS 3.3  HGB 10.2*  HCT 32.7*  MCV 74.3*  PLT 289   Cardiac Enzymes:  Recent Labs Lab 10/09/13 0521  TROPONINI <0.30    BNP (last 3 results) No results found for this basename: PROBNP,  in the last 8760 hours CBG: No results found for this basename: GLUCAP,  in the last 168 hours  Radiological Exams on Admission: Dg Chest 2 View  10/09/2013   CLINICAL DATA:  Left chest pain, shortness of breath.  Nonsmoker.  EXAM: CHEST  2 VIEW  COMPARISON:  None.  FINDINGS: Cardiomediastinal silhouette is unremarkable. The lungs are clear without pleural effusions or focal consolidations. Trachea projects midline and there is no pneumothorax. Soft tissue planes and included osseous structures are non-suspicious. Multiple EKG lines overlie the patient and may obscure subtle underlying pathology.  IMPRESSION: No acute cardiopulmonary process.   Electronically  Signed   By: Elon Alas   On: 10/09/2013 05:40    EKG: Independently reviewed. Sinus rhythm, rate 72, nonspecific T wave abnormality  Assessment/Plan  Atypical Chest Pain  -Patient was transferred from [facility] due to continued chest pain -Risk factors include family history as well as hyperlipidemia -Admitted to [facility] -Troponin negative X1 -Will continue to cycle troponin, obtain magnesium, phosphate, TSH, lipid panel. -Consult cardiology for further medications and possible intervention -EKG showed sinus rhythm  Hyperlipidemia -Patient was diagnosed with hyperlipidemia 6 years ago however discontinued her medication 2 years ago. -Will obtain a fasting lipid panel.  Microcytic anemia -Hemoglobin 10.2 -Patient denies any rectal bleeding, maybe secondary to menstrual bleeding -Will obtain anemia panel as well as monitor her CBC.  DVT prophylaxis: Heparin  Code Status: Full  Condition: Guarded  Family Communication: Daughter at bedside. Admission, patients condition and plan of care including tests being ordered have been discussed with the patient and family who indicate understanding and agree with the plan and Code Status.  Disposition Plan: Admitted for observation.  Time spent: 60 minutes  Haley Roza D.O. [facility] Pager [phone]  If 7PM-7AM, please contact night-coverage [email or web address] Password [email or web address] 10/09/2013, 1:54 PM

## 2013-10-10 DIAGNOSIS — T7840XA Allergy, unspecified, initial encounter: Secondary | ICD-10-CM

## 2013-10-10 DIAGNOSIS — R220 Localized swelling, mass and lump, head: Secondary | ICD-10-CM | POA: Diagnosis present

## 2013-10-10 DIAGNOSIS — E785 Hyperlipidemia, unspecified: Secondary | ICD-10-CM | POA: Diagnosis present

## 2013-10-10 DIAGNOSIS — R072 Precordial pain: Secondary | ICD-10-CM

## 2013-10-10 LAB — GLUCOSE, CAPILLARY: GLUCOSE-CAPILLARY: 169 mg/dL — AB (ref 70–99)

## 2013-10-10 LAB — TROPONIN I

## 2013-10-10 MED ORDER — PREDNISONE 20 MG PO TABS
40.0000 mg | ORAL_TABLET | Freq: Every day | ORAL | Status: DC
Start: 2013-10-10 — End: 2013-10-11

## 2013-10-10 MED ORDER — METHYLPREDNISOLONE SODIUM SUCC 125 MG IJ SOLR
60.0000 mg | Freq: Four times a day (QID) | INTRAMUSCULAR | Status: AC
  Administered 2013-10-10 – 2013-10-11 (×2): 60 mg via INTRAVENOUS
  Filled 2013-10-10 (×2): qty 0.96

## 2013-10-10 MED ORDER — DIPHENHYDRAMINE HCL 25 MG PO CAPS
25.0000 mg | ORAL_CAPSULE | Freq: Four times a day (QID) | ORAL | Status: DC | PRN
  Administered 2013-10-10 – 2013-10-11 (×2): 25 mg via ORAL
  Filled 2013-10-10 (×2): qty 1

## 2013-10-10 MED ORDER — SODIUM CHLORIDE 0.9 % IV BOLUS (SEPSIS)
500.0000 mL | Freq: Once | INTRAVENOUS | Status: AC
  Administered 2013-10-10: 500 mL via INTRAVENOUS

## 2013-10-10 MED ORDER — METHYLPREDNISOLONE SODIUM SUCC 125 MG IJ SOLR
60.0000 mg | Freq: Once | INTRAMUSCULAR | Status: AC
  Administered 2013-10-10: 60 mg via INTRAVENOUS
  Filled 2013-10-10: qty 0.96
  Filled 2013-10-10: qty 2

## 2013-10-10 MED ORDER — ALBUTEROL SULFATE (2.5 MG/3ML) 0.083% IN NEBU: 2.5000 mg | INHALATION_SOLUTION | RESPIRATORY_TRACT | Status: DC | PRN

## 2013-10-10 MED ORDER — SODIUM CHLORIDE 0.9 % IV BOLUS (SEPSIS): 500.0000 mL | Freq: Once | INTRAVENOUS | Status: DC

## 2013-10-10 NOTE — Progress Notes (Signed)
10/08/2013 Patient felt the swelling in face was getting worse this evening, Dr Broadus John was made aware. She was given a one time dose of Solu-medrol 60 mg iv and was told by MD to monitor patient for a hour and thirty minutes, because plan was to d/c patient home. Patient was still concern about the swelling, so MD was notifies and she decided to d/c the discharge order and monitor patient over night. Orders were place to give patient some more Solu-medrol [facility]

## 2013-10-10 NOTE — Discharge Summary (Addendum)
Physician Discharge Summary  Alexandria Herrera EBR:[id number] DOB: 1970-06-15 DOA: 10/09/2013  PCP: No primary provider on file.  Admit date: 10/09/2013 Discharge date: 10/10/2013  Time spent: 35 minutes  Recommendations for Outpatient Follow-up:  1. PCP in 1 week  Discharge Diagnoses:  Active Problems:   Chest pain-musculoskeletal   Swelling of face   Allergic reaction to IV morphine   Dyslipidemia   Discharge Condition: stable  Diet recommendation: heart healthy  Filed Weights   10/09/13 0455 10/09/13 1247  Weight: 70.308 kg (155 lb) 74 kg (163 lb 2.3 oz)    History of present illness:  Alexandria Herrera is a 43 y.o. female  With a history of hyperlipidemia that presented to [place] for chest pain. Patient stated her chest pain cervical left side and radiated to her left arm as well as shoulder which started yesterday evening. She described it as a tightness and heaviness.  She tried taking 2 baby aspirin as before going to the emergency department. Patient denied any heavy lifting, pulling or pushing.    Hospital Course:  1. Chest/shoulder pain -Atypical, felt to have muscular etiology  -EKG, non specific, troponin negative x3  -ECHo today with normal EF and wall motion -Followed by cardiology, no further workup recommended, advised NSAIDs PRN -No gall bladder disease on [place]   2. Hypotension/facial swelling  -suspect reaction to IV morphine, got 2 doses of this yesterday  -Stopped morphine, given benadryl PRN  -BP improved after IVF -given solumedrol this afternoon and will send home on prednisone taper for 2 days if stable  3. Headache/shoulder pain -multiple non specific vague complaints -chronic, tylenol PRN  -FU with PCP, no need for inpatient workup  4. Dyslipidemia -life style modification advised, if doesn't improve, needs meds   Procedures:  ECHO: normal EF and wall motion  Consultations:  cardiology  Discharge Exam: Filed Vitals:    10/10/13 1149  BP: 94/45  Pulse: 66  Temp: 98.2 F (36.8 C)  Resp: 17    General: AAOx3, no distress, mild lower face swelling Cardiovascular: S1S2/RRR Respiratory: CTAB  Discharge Instructions You were cared for by a hospitalist during your hospital stay. If you have any questions about your discharge medications or the care you received while you were in the hospital after you are discharged, you can call the unit and asked to speak with the hospitalist on call if the hospitalist that took care of you is not available. Once you are discharged, your primary care physician will handle any further medical issues. Please note that NO REFILLS for any discharge medications will be authorized once you are discharged, as it is imperative that you return to your primary care physician (or establish a relationship with a primary care physician if you do not have one) for your aftercare needs so that they can reassess your need for medications and monitor your lab values.  Discharge Instructions   Diet - low sodium heart healthy    Complete by:  As directed      Increase activity slowly    Complete by:  As directed             Medication List         predniSONE 20 MG tablet  Commonly known as:  DELTASONE  Take 2 tablets (40 mg total) by mouth daily with breakfast. For 2days       Allergies  Allergen Reactions  . Morphine And Related     Face and lip swelling  The results of significant diagnostics from this hospitalization (including imaging, microbiology, ancillary and laboratory) are listed below for reference.    Significant Diagnostic Studies: Dg Chest 2 View  10/09/2013   CLINICAL DATA:  Left chest pain, shortness of breath.  Nonsmoker.  EXAM: CHEST  2 VIEW  COMPARISON:  None.  FINDINGS: Cardiomediastinal silhouette is unremarkable. The lungs are clear without pleural effusions or focal consolidations. Trachea projects midline and there is no pneumothorax. Soft tissue  planes and included osseous structures are non-suspicious. Multiple EKG lines overlie the patient and may obscure subtle underlying pathology.  IMPRESSION: No acute cardiopulmonary process.   Electronically Signed   By: Elon Alas   On: 10/09/2013 05:40   US Abdomen Complete  10/09/2013   CLINICAL DATA:  Pain with eating.  EXAM: ULTRASOUND ABDOMEN COMPLETE  COMPARISON:  None.  FINDINGS: Gallbladder:  No gallstones or wall thickening visualized. No sonographic Murphy sign noted.  Common bile duct:  Diameter: 4.1 mm  Liver:  Liver is mildly hyperechoic. There is a small oval focal hypoechoic lesion measuring 18 mm x 14 mm x 17 mm lying at the dome of the right lobe. This is nonspecific. No other liver lesion.  IVC:  No abnormality visualized.  Pancreas:  Visualized portion unremarkable.  Spleen:  Size and appearance within normal limits.  Right Kidney:  Length: 11.3 cm. Echogenicity within normal limits. No mass or hydronephrosis visualized.  Left Kidney:  Length: 10.9 cm. Echogenicity within normal limits. No mass or hydronephrosis visualized.  Abdominal aorta:  No aneurysm visualized.  Other findings:  None.  IMPRESSION: 1. No acute findings. 2. Normal gallbladder with no bile duct dilation. 3. Mild increased echogenicity of the liver suggesting hepatic steatosis. 4. Small oval hypoechoic liver lesion at the dome of the right lobe. This is nonspecific. Consider further evaluation with three-phase CT or MRI. Given the position of this lesion under the hemidiaphragm, CT may be preferable. 5. No other abnormalities.   Electronically Signed   By: Lajean Manes M.D.   On: 10/09/2013 15:37    Microbiology: Recent Results (from the past 240 hour(s))  MRSA PCR SCREENING     Status: None   Collection Time    10/09/13  2:07 PM      Result Value Ref Range Status   MRSA by PCR NEGATIVE  NEGATIVE Final   Comment:            The GeneXpert MRSA Assay (FDA     approved for NASAL specimens     only), is one  component of a     comprehensive MRSA colonization     surveillance program. It is not     intended to diagnose MRSA     infection nor to guide or     monitor treatment for     MRSA infections.     Labs: Basic Metabolic Panel:  Recent Labs Lab 10/09/13 0521 10/09/13 1415 10/09/13 1421  NA 140  --   --   K 4.0  --   --   CL 105  --   --   CO2 22  --   --   GLUCOSE 106*  --   --   BUN 9  --   --   CREATININE 0.60  --  0.57  CALCIUM 9.1  --   --   MG  --   --  2.1  PHOS  --  3.3  --    Liver Function Tests:  Recent Labs Lab 10/09/13 1421  AST 18  ALT 12  ALKPHOS 75  BILITOT <0.2*  PROT 7.5  ALBUMIN 3.9    Recent Labs Lab 10/09/13 1415 10/09/13 1421  LIPASE 29  --   AMYLASE  --  44   No results found for this basename: AMMONIA,  in the last 168 hours CBC:  Recent Labs Lab 10/09/13 0521 10/09/13 1421  WBC 8.7 9.1  NEUTROABS 3.3  --   HGB 10.2* 10.5*  HCT 32.7* 34.4*  MCV 74.3* 74.0*  PLT 289 328   Cardiac Enzymes:  Recent Labs Lab 10/09/13 0521 10/09/13 1416 10/09/13 1949 10/10/13 0212  TROPONINI <0.30 <0.30 <0.30 <0.30   BNP: BNP (last 3 results) No results found for this basename: PROBNP,  in the last 8760 hours CBG: No results found for this basename: GLUCAP,  in the last 168 hours     Signed:  Chaquita Basques  [facility] 10/10/2013, 3:09 PM

## 2013-10-10 NOTE — Progress Notes (Signed)
[facility] PROGRESS NOTE  Marly Schuld IDP:[id number] DOB: [birth year] DOA: 10/09/2013 PCP: No primary provider on file.  Assessment/Plan: 1. Chest/shoulder pain -Atypical suspect muscular etiology -EKG, non specific, troponin negative x3 -ECHo today -Cards following -No gall bladder disease on [place]  2. Hypotension/facial swelling -suspect reaction to IV morphine, got 2 doses yesterday -Stopped morphine, benadryl PRN -BP improved  3. Headache -chronic, tylenol PRN -FU with PCP  DVT proph: Hep SQ  Code Status: Full Code Family Communication: d/w daughter at bedside Disposition Plan: home later today if ECHO normal   Consultants:  Cards  HPI/Subjective: Complains of headache  Objective: Filed Vitals:   10/10/13 0900  BP: 104/63  Pulse: 65  Temp:   Resp: 19    Intake/Output Summary (Last 24 hours) at 10/10/13 0959 Last data filed at 10/09/13 1256  Gross per 24 hour  Intake      0 ml  Output   1000 ml  Net  -1000 ml   Filed Weights   10/09/13 0455 10/09/13 1247  Weight: 70.308 kg (155 lb) 74 kg (163 lb 2.3 oz)    Exam:   General:  AAOx3  HEENt: some facial and lip swelling  Cardiovascular:S1S2/RRR  Respiratory: CTAB  Abdomen: soft, NT, BS present  Musculoskeletal: no edema c/c   Data Reviewed: Basic Metabolic Panel:  Recent Labs Lab 10/09/13 0521 10/09/13 1415 10/09/13 1421  NA 140  --   --   K 4.0  --   --   CL 105  --   --   CO2 22  --   --   GLUCOSE 106*  --   --   BUN 9  --   --   CREATININE 0.60  --  0.57  CALCIUM 9.1  --   --   MG  --   --  2.1  PHOS  --  3.3  --    Liver Function Tests:  Recent Labs Lab 10/09/13 1421  AST 18  ALT 12  ALKPHOS 75  BILITOT <0.2*  PROT 7.5  ALBUMIN 3.9    Recent Labs Lab 10/09/13 1415 10/09/13 1421  LIPASE 29  --   AMYLASE  --  44   No results found for this basename: AMMONIA,  in the last 168 hours CBC:  Recent Labs Lab 10/09/13 0521 10/09/13 1421  WBC 8.7 9.1   NEUTROABS 3.3  --   HGB 10.2* 10.5*  HCT 32.7* 34.4*  MCV 74.3* 74.0*  PLT 289 328   Cardiac Enzymes:  Recent Labs Lab 10/09/13 0521 10/09/13 1416 10/09/13 1949 10/10/13 0212  TROPONINI <0.30 <0.30 <0.30 <0.30   BNP (last 3 results) No results found for this basename: PROBNP,  in the last 8760 hours CBG: No results found for this basename: GLUCAP,  in the last 168 hours  Recent Results (from the past 240 hour(s))  MRSA PCR SCREENING     Status: None   Collection Time    10/09/13  2:07 PM      Result Value Ref Range Status   MRSA by PCR NEGATIVE  NEGATIVE Final   Comment:            The GeneXpert MRSA Assay (FDA     approved for NASAL specimens     only), is one component of a     comprehensive MRSA colonization     surveillance program. It is not     intended to diagnose MRSA     infection nor to guide  or     monitor treatment for     MRSA infections.     Studies: Dg Chest 2 View  10/09/2013   CLINICAL DATA:  Left chest pain, shortness of breath.  Nonsmoker.  EXAM: CHEST  2 VIEW  COMPARISON:  None.  FINDINGS: Cardiomediastinal silhouette is unremarkable. The lungs are clear without pleural effusions or focal consolidations. Trachea projects midline and there is no pneumothorax. Soft tissue planes and included osseous structures are non-suspicious. Multiple EKG lines overlie the patient and may obscure subtle underlying pathology.  IMPRESSION: No acute cardiopulmonary process.   Electronically Signed   By: Elon Alas   On: 10/09/2013 05:40   US Abdomen Complete  10/09/2013   CLINICAL DATA:  Pain with eating.  EXAM: ULTRASOUND ABDOMEN COMPLETE  COMPARISON:  None.  FINDINGS: Gallbladder:  No gallstones or wall thickening visualized. No sonographic Murphy sign noted.  Common bile duct:  Diameter: 4.1 mm  Liver:  Liver is mildly hyperechoic. There is a small oval focal hypoechoic lesion measuring 18 mm x 14 mm x 17 mm lying at the dome of the right lobe. This is  nonspecific. No other liver lesion.  IVC:  No abnormality visualized.  Pancreas:  Visualized portion unremarkable.  Spleen:  Size and appearance within normal limits.  Right Kidney:  Length: 11.3 cm. Echogenicity within normal limits. No mass or hydronephrosis visualized.  Left Kidney:  Length: 10.9 cm. Echogenicity within normal limits. No mass or hydronephrosis visualized.  Abdominal aorta:  No aneurysm visualized.  Other findings:  None.  IMPRESSION: 1. No acute findings. 2. Normal gallbladder with no bile duct dilation. 3. Mild increased echogenicity of the liver suggesting hepatic steatosis. 4. Small oval hypoechoic liver lesion at the dome of the right lobe. This is nonspecific. Consider further evaluation with three-phase CT or MRI. Given the position of this lesion under the hemidiaphragm, CT may be preferable. 5. No other abnormalities.   Electronically Signed   By: Lajean Manes M.D.   On: 10/09/2013 15:37    Scheduled Meds: . aspirin EC  325 mg Oral Daily  . heparin  5,000 Units Subcutaneous 3 times per day  . pantoprazole  40 mg Oral Daily  . sodium chloride  500 mL Intravenous Once   Continuous Infusions:  Antibiotics Given (last 72 hours)   None      Active Problems:   Chest pain    Time spent: 85min    [facility] Pager [phone]. If 7PM-7AM, please contact night-coverage at [email or web address], password [facility] 10/10/2013, 9:59 AM  LOS: 1 day

## 2013-10-10 NOTE — Progress Notes (Addendum)
10/10/2013 It was reported to first Shift RN during morning report patient had swelling around the mouth and face by third shift nurse.  She also c/o headache and dizziness. Patient was assess and Dr Broadus John was notified and made aware of the swelling, headache and dizziness. Patient did come in with some dizziness. Order was given for Benadryl  Po and patient was given a dose at 0942, also per MD will be on the floor to assess patient. [facility] RN.

## 2013-10-10 NOTE — Progress Notes (Signed)
  Echocardiogram 2D Echocardiogram has been performed.  Darlina Sicilian M 10/10/2013, 1:56 PM

## 2013-10-10 NOTE — Progress Notes (Signed)
Pt.'s BP dropped to 65-84/39-57.  Enc.'d increased po fluids.  Elevated FOB slightly.  Zofran IV given as ordered for nausea.  Given sprite x2.  Gave graham crax. Per pt. Request after nausea subsided.  Bp currently 81/52.  Denied any other symptoms such as CP, N/T.  BP had been running 90's/50's-120's/70's prior.  Pt. C/o'd of dizziness - Provider notified and 500cc bolus NS given.  - Pt. Assisted up to [facility] when bolus finished.  Facial swelling noted.  LS remain clear at present.  Remains dizzy, but was steady on feet.  Pt. Started menses.

## 2013-10-10 NOTE — Progress Notes (Signed)
Utilization review completed.  

## 2013-10-10 NOTE — Progress Notes (Signed)
10/10/13 patient stated never had pneumonia shot and did not want it. [facility] RN.

## 2013-10-10 NOTE — Progress Notes (Signed)
10/10/2013 Patient had transfer order to [facility], she want to stay on [facility]. She was transfer to 3[address]07 did report to nurse transferring her she still have dizziness when she gets up, also feel she would pass out. Yellow arm band and sock was placed on patient and receiving nurse on [facility]  Is aware. [facility] RN.

## 2013-10-10 NOTE — Progress Notes (Signed)
Patient Name: Alexandria Herrera Date of Encounter: 10/10/2013     Active Problems:   Chest pain    SUBJECTIVE  Still complains of some mild left upper chest and shoulder discomfort.  No objective evidence of cardiac disease so far.  Troponins are normal.  EKG was reviewed personally shows no ischemic changes.  CURRENT MEDS . aspirin EC  325 mg Oral Daily  . heparin  5,000 Units Subcutaneous 3 times per day  . pantoprazole  40 mg Oral Daily  . sodium chloride  500 mL Intravenous Once    OBJECTIVE  Filed Vitals:   10/10/13 0602 10/10/13 0700 10/10/13 0715 10/10/13 0900  BP: 85/29 91/44 93/50  104/63  Pulse: 67 64 66 65  Temp: 98.1 F (36.7 C)  97.5 F (36.4 C)   TempSrc: Oral  Oral   Resp: 17 15 14 19   Height:      Weight:      SpO2: 99% 100% 100% 100%    Intake/Output Summary (Last 24 hours) at 10/10/13 1013 Last data filed at 10/10/13 1006  Gross per 24 hour  Intake    480 ml  Output   1000 ml  Net   -520 ml   Filed Weights   10/09/13 0455 10/09/13 1247  Weight: 155 lb (70.308 kg) 163 lb 2.3 oz (74 kg)    PHYSICAL EXAM  General: Pleasant, NAD. Neuro: Alert and oriented X 3. Moves all extremities spontaneously. Psych: Normal affect. HEENT:  Normal  Neck: Supple without bruits or JVD. Lungs:  Resp regular and unlabored, CTA. Heart: RRR no s3, s4, or murmurs. Abdomen: Soft, non-tender, non-distended, BS + x 4.  Extremities: No clubbing, cyanosis or edema. DP/PT/Radials 2+ and equal bilaterally.  Accessory Clinical Findings  CBC  Recent Labs  10/09/13 0521 10/09/13 1421  WBC 8.7 9.1  NEUTROABS 3.3  --   HGB 10.2* 10.5*  HCT 32.7* 34.4*  MCV 74.3* 74.0*  PLT 289 683   Basic Metabolic Panel  Recent Labs  10/09/13 0521 10/09/13 1415 10/09/13 1421  NA 140  --   --   K 4.0  --   --   CL 105  --   --   CO2 22  --   --   GLUCOSE 106*  --   --   BUN 9  --   --   CREATININE 0.60  --  0.57  CALCIUM 9.1  --   --   MG  --   --  2.1  PHOS  --   3.3  --    Liver Function Tests  Recent Labs  10/09/13 1421  AST 18  ALT 12  ALKPHOS 75  BILITOT <0.2*  PROT 7.5  ALBUMIN 3.9    Recent Labs  10/09/13 1415 10/09/13 1421  LIPASE 29  --   AMYLASE  --  44   Cardiac Enzymes  Recent Labs  10/09/13 1416 10/09/13 1949 10/10/13 0212  TROPONINI <0.30 <0.30 <0.30   BNP No components found with this basename: POCBNP,  D-Dimer No results found for this basename: DDIMER,  in the last 72 hours Hemoglobin A1C No results found for this basename: HGBA1C,  in the last 72 hours Fasting Lipid Panel  Recent Labs  10/09/13 1421  CHOL 273*  HDL 35*  LDLCALC 187*  TRIG 254*  CHOLHDL 7.8   Thyroid Function Tests  Recent Labs  10/09/13 1416  TSH 2.630    TELE  Normal sinus rhythm  ECG  In  sinus rhythm.  No ischemic changes.  Radiology/Studies  Dg Chest 2 View  10/09/2013   CLINICAL DATA:  Left chest pain, shortness of breath.  Nonsmoker.  EXAM: CHEST  2 VIEW  COMPARISON:  None.  FINDINGS: Cardiomediastinal silhouette is unremarkable. The lungs are clear without pleural effusions or focal consolidations. Trachea projects midline and there is no pneumothorax. Soft tissue planes and included osseous structures are non-suspicious. Multiple EKG lines overlie the patient and may obscure subtle underlying pathology.  IMPRESSION: No acute cardiopulmonary process.   Electronically Signed   By: Elon Alas   On: 10/09/2013 05:40   US Abdomen Complete  10/09/2013   CLINICAL DATA:  Pain with eating.  EXAM: ULTRASOUND ABDOMEN COMPLETE  COMPARISON:  None.  FINDINGS: Gallbladder:  No gallstones or wall thickening visualized. No sonographic Murphy sign noted.  Common bile duct:  Diameter: 4.1 mm  Liver:  Liver is mildly hyperechoic. There is a small oval focal hypoechoic lesion measuring 18 mm x 14 mm x 17 mm lying at the dome of the right lobe. This is nonspecific. No other liver lesion.  IVC:  No abnormality visualized.   Pancreas:  Visualized portion unremarkable.  Spleen:  Size and appearance within normal limits.  Right Kidney:  Length: 11.3 cm. Echogenicity within normal limits. No mass or hydronephrosis visualized.  Left Kidney:  Length: 10.9 cm. Echogenicity within normal limits. No mass or hydronephrosis visualized.  Abdominal aorta:  No aneurysm visualized.  Other findings:  None.  IMPRESSION: 1. No acute findings. 2. Normal gallbladder with no bile duct dilation. 3. Mild increased echogenicity of the liver suggesting hepatic steatosis. 4. Small oval hypoechoic liver lesion at the dome of the right lobe. This is nonspecific. Consider further evaluation with three-phase CT or MRI. Given the position of this lesion under the hemidiaphragm, CT may be preferable. 5. No other abnormalities.   Electronically Signed   By: Lajean Manes M.D.   On: 10/09/2013 15:37    ASSESSMENT AND PLAN  1.  Chest pain most likely musculoskeletal 2. hypercholesterolemia  Recommendation: Await results of echocardiogram.  If echo is unremarkable, no further inpatient cardiac tests.  Follow up with her PCP regarding hypercholesterolemia. Signed, Darlin Coco MD

## 2013-10-10 NOTE — Progress Notes (Signed)
10/10/2013 Patient c/o iv sight was hurting, she did not want Rn to change site. Rn notified IV team to assess site and patient was told by IV team that site needed to be change. New site was place in the left arm by IV therapy at [place].

## 2013-10-11 DIAGNOSIS — K769 Liver disease, unspecified: Secondary | ICD-10-CM | POA: Diagnosis present

## 2013-10-11 LAB — GLUCOSE, CAPILLARY: GLUCOSE-CAPILLARY: 148 mg/dL — AB (ref 70–99)

## 2013-10-11 MED ORDER — PREDNISONE 20 MG PO TABS
20.0000 mg | ORAL_TABLET | Freq: Every day | ORAL | Status: DC
  Filled 2013-10-11 (×2): qty 1

## 2013-10-11 MED ORDER — ATORVASTATIN CALCIUM 20 MG PO TABS: 20.0000 mg | ORAL_TABLET | Freq: Every day | ORAL | Status: DC

## 2013-10-11 MED ORDER — CYCLOBENZAPRINE HCL 5 MG PO TABS: 5.0000 mg | ORAL_TABLET | Freq: Three times a day (TID) | ORAL | Status: DC | PRN

## 2013-10-11 MED ORDER — PREDNISONE 20 MG PO TABS: 20.0000 mg | ORAL_TABLET | Freq: Every day | ORAL | Status: DC

## 2013-10-11 NOTE — Progress Notes (Signed)
Pt discharged to home hemodynamically stable

## 2013-10-11 NOTE — Care Management Note (Signed)
    Page 1 of 1   10/11/2013     12:17:12 PM CARE MANAGEMENT NOTE 10/11/2013  Patient:  Alexandria Herrera, Alexandria Herrera   Account Number:  [id number]  Date Initiated:  10/11/2013  Documentation initiated by:  [facility]  Subjective/Objective Assessment:   adm: Chest pain-musculoskeletal     Action/Plan:   discharge planning   Anticipated DC Date:  10/11/2013   Anticipated DC Plan:  [place]  CM consult  [facility]      Choice offered to / List presented to:             Status of service:  Completed, signed off Medicare Important Message given?   (If response is "NO", the following Medicare IM given date fields will be blank) Date Medicare IM given:   Medicare IM given by:   Date Additional Medicare IM given:   Additional Medicare IM given by:    Discharge Disposition:  HOME/SELF CARE  Per UR Regulation:    If discussed at Long Length of Stay Meetings, dates discussed:    Comments:  10/11/13 11:00 Cm met with pt in room to give pt [facility] pamphlet.  Pt verbalizes understanding she is to go to the clinic any day this next week from 09:00 - 10:00 to schedule an appt for navigator aid to secure insurance, to secure a PCP, and to schedule follow up care.  No other CM needs were communicated.  Mariane Masters, BSN, [place].

## 2013-11-14 ENCOUNTER — Ambulatory Visit: Payer: Medicaid Other | Attending: Family Medicine | Admitting: Family Medicine

## 2013-11-14 ENCOUNTER — Encounter: Payer: Self-pay | Admitting: Family Medicine

## 2013-11-14 VITALS — BP 114/73 | HR 68 | Temp 99.2°F | Resp 16 | Ht 67.0 in | Wt 164.0 lb

## 2013-11-14 DIAGNOSIS — R51 Headache: Secondary | ICD-10-CM | POA: Diagnosis not present

## 2013-11-14 DIAGNOSIS — R55 Syncope and collapse: Secondary | ICD-10-CM | POA: Insufficient documentation

## 2013-11-14 DIAGNOSIS — R609 Edema, unspecified: Secondary | ICD-10-CM | POA: Insufficient documentation

## 2013-11-14 DIAGNOSIS — R002 Palpitations: Secondary | ICD-10-CM

## 2013-11-14 DIAGNOSIS — K769 Liver disease, unspecified: Secondary | ICD-10-CM

## 2013-11-14 DIAGNOSIS — K7689 Other specified diseases of liver: Secondary | ICD-10-CM | POA: Insufficient documentation

## 2013-11-14 DIAGNOSIS — R234 Changes in skin texture: Secondary | ICD-10-CM | POA: Insufficient documentation

## 2013-11-14 DIAGNOSIS — R220 Localized swelling, mass and lump, head: Secondary | ICD-10-CM

## 2013-11-14 DIAGNOSIS — K5900 Constipation, unspecified: Secondary | ICD-10-CM | POA: Insufficient documentation

## 2013-11-14 DIAGNOSIS — R221 Localized swelling, mass and lump, neck: Secondary | ICD-10-CM

## 2013-11-14 DIAGNOSIS — K5901 Slow transit constipation: Secondary | ICD-10-CM

## 2013-11-14 MED ORDER — POLYETHYLENE GLYCOL 3350 17 GM/SCOOP PO POWD: 17.0000 g | Freq: Every day | ORAL | Status: DC

## 2013-11-14 NOTE — Assessment & Plan Note (Signed)
Chronic Trial of miralax

## 2013-11-14 NOTE — Patient Instructions (Signed)
Alexandria Herrera,  Thank you for coming in today. I look forward to working with you being a primary doctor.  I placed referral to plastic surgery and cardiology.  We will schedule a CT scan of your abdomen and call you with results once done.  Please plan to follow up with me in 4-6 weeks by then you should have seen cardiology and possibly plastic surgery as well. We will do your Pap smear a followup appointment. Followup sooner if needed  Dr. Adrian Blackwater

## 2013-11-14 NOTE — Progress Notes (Signed)
   Subjective:    Patient ID: Alexandria Herrera, female    DOB: 03-29-1970, 43 y.o.   MRN: [id number] CC: establish care, R lobe liver lesions, facial pain, palpitations  HPI 43 year old female presents to establish care discussed the following:  #1 right lobe with a lesion: Patient was diagnosed with a right lobe liver lesion which is in the hospital for palpitations. She had an abdominal ultrasound. She denies right upper quadrant pain. She has occasional right lower quadrant pain. This pain associated with constipation. She reports having a bowel movement every 4 days. She denies fever, chills, weight loss, nausea, vomiting.  #2 facial pain: Patient reports pain in her face since having injections which he believes was collagen 3 years ago. Injections were to bilateral cheeks. She she now has areas of induration or tender. Intermittent she'll have significant swelling. Swelling has been treated in the past with prednisone and improved.  #3 palpitations: Patient had intermittent palpitations for the past few years. Palpitations last for less than a minute. They're sometimes associated with presyncope. Patient takes one cup of coffee daily. She takes no over-the-counter stimulants. She's never been treated for palpitations.  Social history: Patient is a chronic nonsmoker Review of Systems As per history of present illness     Objective:   Physical Exam Ht 5\' 7"  (1.702 m)  Wt 164 lb (74.39 kg)  BMI 25.68 kg/m2 General appearance: alert, cooperative and no distress Face: fullness to b/l maxilla. Multiple areas of induration. Mild tenderness. No fluctuance or erythema.  Lungs: clear to auscultation bilaterally Heart: regular rate and rhythm, S1, S2 normal, no murmur, click, rub or gallop Abdomen: soft, non-tender; bowel sounds normal; no masses,  no organomegaly Extremities: extremities normal, atraumatic, no cyanosis or edema     Assessment & Plan:

## 2013-11-14 NOTE — Progress Notes (Signed)
Pt is here to establish care. Pt is here following after her ED visit with chest pain and hyperlipidemia. Pt reports having pain in the right side of her abdomen that's getting worse each day.

## 2013-11-14 NOTE — Assessment & Plan Note (Addendum)
A: incidental finding P:  F/u CT scan. Reviewed CT of abdomen. Area of concern on liver is 17 mm (a bit larger than the width on a finger). BENIGN appearing process. No need for concern.  Plan is for f/u MRI with contrast in 6 months to determine what time of benign process is occuring adenoma vs focal nodular hyperplasia.

## 2013-11-14 NOTE — Assessment & Plan Note (Signed)
A: reactive presumably for previous injections. No signs of infectious process  P:  Plastic surgery referral to discuss possible treatment options.

## 2013-11-14 NOTE — Assessment & Plan Note (Signed)
A: persistent. Negative recent cardiac w/u.  P: Cards referral for ETT +/-  Holter monitor.

## 2013-11-18 ENCOUNTER — Ambulatory Visit (HOSPITAL_COMMUNITY): Payer: MEDICAID

## 2013-11-18 ENCOUNTER — Ambulatory Visit (HOSPITAL_COMMUNITY)
Admission: RE | Admit: 2013-11-18 | Discharge: 2013-11-18 | Disposition: A | Discharge status: Home / Self Care | Payer: Medicaid Other | Source: Ambulatory Visit | Attending: Family Medicine | Admitting: Family Medicine

## 2013-11-18 ENCOUNTER — Encounter (HOSPITAL_COMMUNITY): Payer: Self-pay

## 2013-11-18 DIAGNOSIS — K7689 Other specified diseases of liver: Secondary | ICD-10-CM | POA: Insufficient documentation

## 2013-11-18 DIAGNOSIS — K769 Liver disease, unspecified: Secondary | ICD-10-CM

## 2013-11-18 MED ORDER — IOHEXOL 300 MG/ML  SOLN
80.0000 mL | Freq: Once | INTRAMUSCULAR | Status: AC | PRN
  Administered 2013-11-18: 80 mL via INTRAVENOUS

## 2013-11-24 ENCOUNTER — Telehealth: Payer: Self-pay | Admitting: Family Medicine

## 2013-11-24 NOTE — Telephone Encounter (Signed)
Pt. Called to request CT scan results. Please f/u with pt.

## 2013-11-24 NOTE — Telephone Encounter (Signed)
Pt. Called again to check on CT scan results. Pt. Is continuously calling stating that she will call until somebody answers. Pt. Was told that a note was put in for nurse, and pt. Will get a call back regarding results but pt. Is still calling back.

## 2013-11-24 NOTE — Telephone Encounter (Signed)
Pt. Is very upset and stated she will continue calling until she gets her results....she stated that she feels like our office does not care about her as a patient, and there is no reason why she should wait up to 48 hrs to get her results... I attempted to explain to the patient our efforts in sending a note as well as calling the nurse to speak to her...please cal patient

## 2013-11-25 NOTE — Telephone Encounter (Signed)
Unable to contact patient , voice mail full.   Result Notes    Notes Recorded by Minerva Ends, MD on 11/19/2013 at 9:43 AM Reviewed CT of abdomen. Area of concern on liver is 17 mm (a bit larger than the width on a finger). BENIGN appearing process. No need for concern.  Plan is for f/u MRI with contrast in 6 months to determine what time of benign process is occuring adenoma vs focal nodular hyperplasia.

## 2013-11-25 NOTE — Telephone Encounter (Signed)
Pt aware of CT results. 

## 2013-11-26 ENCOUNTER — Telehealth: Payer: Self-pay | Admitting: Family Medicine

## 2013-11-26 NOTE — Telephone Encounter (Signed)
Pt. Called to check on status of results. Please f/u with pt.

## 2013-11-26 NOTE — Telephone Encounter (Signed)
Called to inform patient regarding CT results. Informed patient about CT results and that PCP would like to f/u with a MRI w/ contrast in 6 months. Patient states she is very concerned about waiting that long. Patient received conflicting information form the hospital and was told she will need to have surgery. Please advise.

## 2013-11-27 NOTE — Telephone Encounter (Signed)
Called patient.  Left VM reiterating CT results and plan.  Reassurance offered. Patient may call back for more info if needed,

## 2013-12-02 ENCOUNTER — Telehealth: Payer: Self-pay | Admitting: Emergency Medicine

## 2013-12-02 NOTE — Telephone Encounter (Signed)
Spoke to pt in regards to CT results. Pt calling for clarification. States she was told she would need surgery for liver mass. Instructed to listen to VM, with further instructions from Dr. Adrian Blackwater. Pt verbalized understanding

## 2013-12-04 NOTE — Telephone Encounter (Signed)
Lorrene Reid, RN at 12/02/2013 2:49 PM     Status: Signed        Spoke to pt in regards to CT results. Pt calling for clarification. States she was told she would need surgery for liver mass. Instructed to listen to VM, with further instructions from Dr. Adrian Blackwater.  Pt verbalized understanding

## 2013-12-15 ENCOUNTER — Institutional Professional Consult (permissible substitution): Payer: Self-pay | Admitting: Internal Medicine

## 2014-01-26 ENCOUNTER — Institutional Professional Consult (permissible substitution): Payer: Self-pay | Admitting: Internal Medicine

## 2014-03-05 ENCOUNTER — Encounter: Payer: Self-pay | Admitting: *Deleted

## 2014-03-07 ENCOUNTER — Emergency Department (HOSPITAL_BASED_OUTPATIENT_CLINIC_OR_DEPARTMENT_OTHER)
Admission: EM | Admit: 2014-03-07 | Discharge: 2014-03-07 | Disposition: A | Discharge status: Home / Self Care | Payer: Medicaid Other | Attending: Emergency Medicine | Admitting: Emergency Medicine

## 2014-03-07 ENCOUNTER — Encounter (HOSPITAL_BASED_OUTPATIENT_CLINIC_OR_DEPARTMENT_OTHER): Payer: Self-pay

## 2014-03-07 DIAGNOSIS — Z3202 Encounter for pregnancy test, result negative: Secondary | ICD-10-CM | POA: Insufficient documentation

## 2014-03-07 DIAGNOSIS — Z8742 Personal history of other diseases of the female genital tract: Secondary | ICD-10-CM | POA: Insufficient documentation

## 2014-03-07 DIAGNOSIS — K529 Noninfective gastroenteritis and colitis, unspecified: Secondary | ICD-10-CM | POA: Diagnosis not present

## 2014-03-07 DIAGNOSIS — Z8639 Personal history of other endocrine, nutritional and metabolic disease: Secondary | ICD-10-CM | POA: Insufficient documentation

## 2014-03-07 DIAGNOSIS — R112 Nausea with vomiting, unspecified: Secondary | ICD-10-CM | POA: Diagnosis present

## 2014-03-07 DIAGNOSIS — Z79899 Other long term (current) drug therapy: Secondary | ICD-10-CM | POA: Insufficient documentation

## 2014-03-07 LAB — URINALYSIS, ROUTINE W REFLEX MICROSCOPIC
Bilirubin Urine: NEGATIVE
GLUCOSE, UA: NEGATIVE mg/dL
Ketones, ur: 40 mg/dL — AB
Leukocytes, UA: NEGATIVE
Nitrite: NEGATIVE
PH: 7 (ref 5.0–8.0)
PROTEIN: NEGATIVE mg/dL
Specific Gravity, Urine: 1.025 (ref 1.005–1.030)
Urobilinogen, UA: 0.2 mg/dL (ref 0.0–1.0)

## 2014-03-07 LAB — CBC WITH DIFFERENTIAL/PLATELET
BASOS PCT: 0 % (ref 0–1)
Basophils Absolute: 0 10*3/uL (ref 0.0–0.1)
Eosinophils Absolute: 0.9 10*3/uL — ABNORMAL HIGH (ref 0.0–0.7)
Eosinophils Relative: 15 % — ABNORMAL HIGH (ref 0–5)
HEMATOCRIT: 32 % — AB (ref 36.0–46.0)
HEMOGLOBIN: 10 g/dL — AB (ref 12.0–15.0)
LYMPHS PCT: 10 % — AB (ref 12–46)
Lymphs Abs: 0.6 10*3/uL — ABNORMAL LOW (ref 0.7–4.0)
MCH: 23.2 pg — ABNORMAL LOW (ref 26.0–34.0)
MCHC: 31.3 g/dL (ref 30.0–36.0)
MCV: 74.2 fL — ABNORMAL LOW (ref 78.0–100.0)
MONO ABS: 0.3 10*3/uL (ref 0.1–1.0)
Monocytes Relative: 6 % (ref 3–12)
Neutro Abs: 4 10*3/uL (ref 1.7–7.7)
Neutrophils Relative %: 69 % (ref 43–77)
Platelets: 263 10*3/uL (ref 150–400)
RBC: 4.31 MIL/uL (ref 3.87–5.11)
RDW: 17.4 % — ABNORMAL HIGH (ref 11.5–15.5)
WBC: 5.7 10*3/uL (ref 4.0–10.5)

## 2014-03-07 LAB — PREGNANCY, URINE: Preg Test, Ur: NEGATIVE

## 2014-03-07 LAB — COMPREHENSIVE METABOLIC PANEL
ALK PHOS: 68 U/L (ref 39–117)
ALT: 16 U/L (ref 0–35)
AST: 24 U/L (ref 0–37)
Albumin: 4 g/dL (ref 3.5–5.2)
Anion gap: 11 (ref 5–15)
BILIRUBIN TOTAL: 0.2 mg/dL — AB (ref 0.3–1.2)
BUN: 5 mg/dL — AB (ref 6–23)
CHLORIDE: 102 meq/L (ref 96–112)
CO2: 24 meq/L (ref 19–32)
Calcium: 8.9 mg/dL (ref 8.4–10.5)
Creatinine, Ser: 0.6 mg/dL (ref 0.50–1.10)
GFR calc non Af Amer: >90 mL/min (ref >90)
Glucose, Bld: 101 mg/dL — ABNORMAL HIGH (ref 70–99)
POTASSIUM: 4 meq/L (ref 3.7–5.3)
Sodium: 137 mEq/L (ref 137–147)
Total Protein: 7.6 g/dL (ref 6.0–8.3)

## 2014-03-07 LAB — URINE MICROSCOPIC-ADD ON

## 2014-03-07 LAB — LIPASE, BLOOD: LIPASE: 27 U/L (ref 11–59)

## 2014-03-07 MED ORDER — SODIUM CHLORIDE 0.9 % IV BOLUS (SEPSIS)
1000.0000 mL | Freq: Once | INTRAVENOUS | Status: AC
  Administered 2014-03-07: 1000 mL via INTRAVENOUS

## 2014-03-07 MED ORDER — DIPHENOXYLATE-ATROPINE 2.5-0.025 MG PO TABS: 1.0000 | ORAL_TABLET | Freq: Four times a day (QID) | ORAL | Status: DC | PRN

## 2014-03-07 MED ORDER — DIPHENOXYLATE-ATROPINE 2.5-0.025 MG PO TABS
2.0000 | ORAL_TABLET | Freq: Once | ORAL | Status: AC
  Administered 2014-03-07: 2 via ORAL
  Filled 2014-03-07: qty 2

## 2014-03-07 MED ORDER — ONDANSETRON HCL 4 MG/2ML IJ SOLN
4.0000 mg | Freq: Once | INTRAMUSCULAR | Status: AC
  Administered 2014-03-07: 4 mg via INTRAVENOUS
  Filled 2014-03-07: qty 2

## 2014-03-07 MED ORDER — DICYCLOMINE HCL 20 MG PO TABS: 20.0000 mg | ORAL_TABLET | Freq: Two times a day (BID) | ORAL | Status: DC

## 2014-03-07 MED ORDER — KETOROLAC TROMETHAMINE 30 MG/ML IJ SOLN
30.0000 mg | Freq: Once | INTRAMUSCULAR | Status: AC
  Administered 2014-03-07: 30 mg via INTRAVENOUS
  Filled 2014-03-07: qty 1

## 2014-03-07 MED ORDER — ONDANSETRON 4 MG PO TBDP: 4.0000 mg | ORAL_TABLET | Freq: Three times a day (TID) | ORAL | Status: DC | PRN

## 2014-03-07 NOTE — Discharge Instructions (Signed)

## 2014-03-07 NOTE — ED Notes (Addendum)
Patient here with vomiting and diarrhea and abdominal cramping that started yesterday. Complains of right leg pain as well and general body aches. Reports multiple episodes of each

## 2014-03-07 NOTE — ED Provider Notes (Signed)
CSN: [id number]     Arrival date & time 03/07/14  1696 History   First MD Initiated Contact with Patient 03/07/14 [phone]     Chief Complaint  Patient presents with  . Emesis      HPI  Patient presents evaluation of nausea vomiting and diarrhea. Symptoms last 2 days. Some abdominal cramping. No blood pus or mucus in the stools. Heme-negative nonbilious emesis. No fevers. No dysuria.  Past Medical History  Diagnosis Date  . Hyperlipemia 1996  . Ovarian cyst 1998, 2001    s/p  cyst removal   . S/P Botox injection 2012    face    Past Surgical History  Procedure Laterality Date  . Ovarian cyst removal    . Tubal ligation  2004  . Breast surgery Bilateral 2000    implants    Family History  Problem Relation Age of Onset  . Hyperlipidemia Mother   . Hyperlipidemia Maternal Grandmother   . Heart disease Maternal Grandfather   . Heart disease Paternal Grandmother   . Cancer Neg Hx    History  Substance Use Topics  . Smoking status: Never Smoker   . Smokeless tobacco: Never Used  . Alcohol Use: No   OB History    No data available     Review of Systems  Constitutional: Negative for fever, chills, diaphoresis, appetite change and fatigue.  HENT: Negative for mouth sores, sore throat and trouble swallowing.   Eyes: Negative for visual disturbance.  Respiratory: Negative for cough, chest tightness, shortness of breath and wheezing.   Cardiovascular: Negative for chest pain.  Gastrointestinal: Positive for nausea, vomiting, abdominal pain and diarrhea. Negative for abdominal distention.  Endocrine: Negative for polydipsia, polyphagia and polyuria.  Genitourinary: Negative for dysuria, frequency and hematuria.  Musculoskeletal: Negative for gait problem.  Skin: Negative for color change, pallor and rash.  Neurological: Negative for dizziness, syncope, light-headedness and headaches.  Hematological: Does not bruise/bleed easily.  Psychiatric/Behavioral: Negative for  behavioral problems and confusion.      Allergies  Morphine and related  Home Medications   Prior to Admission medications   Medication Sig Start Date End Date Taking? Authorizing Provider  dicyclomine (BENTYL) 20 MG tablet Take 1 tablet (20 mg total) by mouth 2 (two) times daily. 03/07/14   Tanna Furry, MD  diphenoxylate-atropine (LOMOTIL) 2.5-0.025 MG per tablet Take 1 tablet by mouth 4 (four) times daily as needed for diarrhea or loose stools. 03/07/14   Tanna Furry, MD  ondansetron (ZOFRAN ODT) 4 MG disintegrating tablet Take 1 tablet (4 mg total) by mouth every 8 (eight) hours as needed for nausea. 03/07/14   Tanna Furry, MD   BP 90/45 mmHg  Pulse 81  Temp(Src) 99.4 F (37.4 C) (Oral)  Resp 16  Ht 5\' 7"  (1.702 m)  Wt 155 lb (70.308 kg)  BMI 24.27 kg/m2  SpO2 99%  LMP 02/13/2014 Physical Exam  Constitutional: She is oriented to person, place, and time. She appears well-developed and well-nourished. No distress.  HENT:  Head: Normocephalic.  Eyes: Conjunctivae are normal. Pupils are equal, round, and reactive to light. No scleral icterus.  Neck: Normal range of motion. Neck supple. No thyromegaly present.  Cardiovascular: Normal rate and regular rhythm.  Exam reveals no gallop and no friction rub.   No murmur heard. Pulmonary/Chest: Effort normal and breath sounds normal. No respiratory distress. She has no wheezes. She has no rales.  Abdominal: Soft. Bowel sounds are normal. She exhibits no distension. There is no  tenderness. There is no rebound.  Soft benign abdomen. No guarding rebound or para Taylor's dictation. Normal active bowel sounds. No focal tenderness.  Musculoskeletal: Normal range of motion.  Neurological: She is alert and oriented to person, place, and time.  Skin: Skin is warm and dry. No rash noted.  Psychiatric: She has a normal mood and affect. Her behavior is normal.    ED Course  Procedures (including critical care time) Labs Review Labs Reviewed   URINALYSIS, ROUTINE W REFLEX MICROSCOPIC - Abnormal; Notable for the following:    APPearance CLOUDY (*)    Hgb urine dipstick SMALL (*)    Ketones, ur 40 (*)    All other components within normal limits  CBC WITH DIFFERENTIAL - Abnormal; Notable for the following:    Hemoglobin 10.0 (*)    HCT 32.0 (*)    MCV 74.2 (*)    MCH 23.2 (*)    RDW 17.4 (*)    Lymphocytes Relative 10 (*)    Lymphs Abs 0.6 (*)    Eosinophils Relative 15 (*)    Eosinophils Absolute 0.9 (*)    All other components within normal limits  COMPREHENSIVE METABOLIC PANEL - Abnormal; Notable for the following:    Glucose, Bld 101 (*)    BUN 5 (*)    Total Bilirubin 0.2 (*)    All other components within normal limits  URINE MICROSCOPIC-ADD ON - Abnormal; Notable for the following:    Squamous Epithelial / LPF MANY (*)    Bacteria, UA MANY (*)    All other components within normal limits  PREGNANCY, URINE  LIPASE, BLOOD    Imaging Review No results found.   EKG Interpretation None      MDM   Final diagnoses:  Gastroenteritis    Abdominal exam. Feels better after IV medications. No additional vomiting or diarrhea. No leukocytosis. Urine does not appear infected. No electrolyte abnormalities she is appropriate for outpatient treatment. Expectant management. Fluid hydration. Zofran Lomotil when necessary. Bentyl when necessary.    Tanna Furry, MD 03/07/14 [phone]

## 2014-03-09 ENCOUNTER — Encounter (HOSPITAL_BASED_OUTPATIENT_CLINIC_OR_DEPARTMENT_OTHER): Payer: Self-pay | Admitting: *Deleted

## 2014-03-09 ENCOUNTER — Emergency Department (HOSPITAL_BASED_OUTPATIENT_CLINIC_OR_DEPARTMENT_OTHER)
Admission: EM | Admit: 2014-03-09 | Discharge: 2014-03-09 | Disposition: A | Discharge status: Home / Self Care | Payer: Medicaid Other | Attending: Emergency Medicine | Admitting: Emergency Medicine

## 2014-03-09 ENCOUNTER — Institutional Professional Consult (permissible substitution): Payer: Self-pay | Admitting: Internal Medicine

## 2014-03-09 DIAGNOSIS — Z8639 Personal history of other endocrine, nutritional and metabolic disease: Secondary | ICD-10-CM | POA: Diagnosis not present

## 2014-03-09 DIAGNOSIS — Z9889 Other specified postprocedural states: Secondary | ICD-10-CM | POA: Insufficient documentation

## 2014-03-09 DIAGNOSIS — Z9851 Tubal ligation status: Secondary | ICD-10-CM | POA: Diagnosis not present

## 2014-03-09 DIAGNOSIS — K920 Hematemesis: Secondary | ICD-10-CM | POA: Diagnosis present

## 2014-03-09 DIAGNOSIS — R109 Unspecified abdominal pain: Secondary | ICD-10-CM | POA: Insufficient documentation

## 2014-03-09 DIAGNOSIS — Z79899 Other long term (current) drug therapy: Secondary | ICD-10-CM | POA: Insufficient documentation

## 2014-03-09 DIAGNOSIS — R197 Diarrhea, unspecified: Secondary | ICD-10-CM | POA: Diagnosis not present

## 2014-03-09 DIAGNOSIS — Z8742 Personal history of other diseases of the female genital tract: Secondary | ICD-10-CM | POA: Insufficient documentation

## 2014-03-09 DIAGNOSIS — R112 Nausea with vomiting, unspecified: Secondary | ICD-10-CM | POA: Insufficient documentation

## 2014-03-09 LAB — URINALYSIS, ROUTINE W REFLEX MICROSCOPIC
BILIRUBIN URINE: NEGATIVE
GLUCOSE, UA: NEGATIVE mg/dL
Hgb urine dipstick: NEGATIVE
KETONES UR: NEGATIVE mg/dL
Leukocytes, UA: NEGATIVE
Nitrite: NEGATIVE
Protein, ur: NEGATIVE mg/dL
Specific Gravity, Urine: 1.002 — ABNORMAL LOW (ref 1.005–1.030)
Urobilinogen, UA: 0.2 mg/dL (ref 0.0–1.0)
pH: 6 (ref 5.0–8.0)

## 2014-03-09 LAB — CBC WITH DIFFERENTIAL/PLATELET
Basophils Absolute: 0 10*3/uL (ref 0.0–0.1)
Basophils Relative: 0 % (ref 0–1)
EOS ABS: 0.6 10*3/uL (ref 0.0–0.7)
EOS PCT: 10 % — AB (ref 0–5)
HCT: 32.7 % — ABNORMAL LOW (ref 36.0–46.0)
Hemoglobin: 10 g/dL — ABNORMAL LOW (ref 12.0–15.0)
LYMPHS ABS: 2.1 10*3/uL (ref 0.7–4.0)
LYMPHS PCT: 38 % (ref 12–46)
MCH: 22.9 pg — ABNORMAL LOW (ref 26.0–34.0)
MCHC: 30.6 g/dL (ref 30.0–36.0)
MCV: 75 fL — AB (ref 78.0–100.0)
MONOS PCT: 7 % (ref 3–12)
Monocytes Absolute: 0.4 10*3/uL (ref 0.1–1.0)
Neutro Abs: 2.5 10*3/uL (ref 1.7–7.7)
Neutrophils Relative %: 45 % (ref 43–77)
PLATELETS: 252 10*3/uL (ref 150–400)
RBC: 4.36 MIL/uL (ref 3.87–5.11)
RDW: 17.6 % — ABNORMAL HIGH (ref 11.5–15.5)
WBC: 5.5 10*3/uL (ref 4.0–10.5)

## 2014-03-09 LAB — COMPREHENSIVE METABOLIC PANEL
ALT: 15 U/L (ref 0–35)
AST: 24 U/L (ref 0–37)
Albumin: 3.9 g/dL (ref 3.5–5.2)
Alkaline Phosphatase: 60 U/L (ref 39–117)
Anion gap: 11 (ref 5–15)
BUN: 4 mg/dL — ABNORMAL LOW (ref 6–23)
CALCIUM: 8.8 mg/dL (ref 8.4–10.5)
CHLORIDE: 107 meq/L (ref 96–112)
CO2: 25 meq/L (ref 19–32)
Creatinine, Ser: 0.6 mg/dL (ref 0.50–1.10)
GLUCOSE: 99 mg/dL (ref 70–99)
Potassium: 4.1 mEq/L (ref 3.7–5.3)
SODIUM: 143 meq/L (ref 137–147)
Total Bilirubin: <0.2 mg/dL — ABNORMAL LOW (ref 0.3–1.2)
Total Protein: 7.4 g/dL (ref 6.0–8.3)

## 2014-03-09 LAB — LIPASE, BLOOD: LIPASE: 25 U/L (ref 11–59)

## 2014-03-09 LAB — OCCULT BLOOD X 1 CARD TO LAB, STOOL: Fecal Occult Bld: NEGATIVE

## 2014-03-09 LAB — TROPONIN I

## 2014-03-09 MED ORDER — SODIUM CHLORIDE 0.9 % IV BOLUS (SEPSIS)
1000.0000 mL | Freq: Once | INTRAVENOUS | Status: AC
  Administered 2014-03-09: 1000 mL via INTRAVENOUS

## 2014-03-09 MED ORDER — ONDANSETRON HCL 4 MG/2ML IJ SOLN
4.0000 mg | Freq: Once | INTRAMUSCULAR | Status: AC
  Administered 2014-03-09: 4 mg via INTRAVENOUS
  Filled 2014-03-09: qty 2

## 2014-03-09 MED ORDER — FAMOTIDINE IN NACL 20-0.9 MG/50ML-% IV SOLN
20.0000 mg | Freq: Once | INTRAVENOUS | Status: AC
  Administered 2014-03-09: 20 mg via INTRAVENOUS
  Filled 2014-03-09: qty 50

## 2014-03-09 MED ORDER — PROMETHAZINE HCL 25 MG RE SUPP: 25.0000 mg | Freq: Four times a day (QID) | RECTAL | Status: DC | PRN

## 2014-03-09 NOTE — ED Notes (Signed)
D/c home with family- directed to pharmacy to pick up meds- note for work given

## 2014-03-09 NOTE — ED Notes (Signed)
Pt given sprite- no v/d noted since arrival to ED

## 2014-03-09 NOTE — ED Provider Notes (Signed)
CSN: [id number]     Arrival date & time 03/09/14  4098 History   First MD Initiated Contact with Patient 03/09/14 [phone]     Chief Complaint  Patient presents with  . Hematemesis     (Consider location/radiation/quality/duration/timing/severity/associated sxs/prior Treatment) HPI Comments: Patient is a 43 yo F PMHx significant for HLD, Ovarian cysts presenting to the ED for continued nausea, vomiting, diarrhea since being seen two days ago. She states she has had four total days of symptoms. Endorses generalized abdominal cramping. She states she noticed blood in her vomit and diarrhea starting last evening. She does endorse drinking Red Gatorades. She states she has tried taking the Lomotil BID and the Zofran TID as prescribed with no improvement. Abdominal surgical history includes Ovarian Cyst removal, Tubal ligation.    Past Medical History  Diagnosis Date  . Hyperlipemia 1996  . Ovarian cyst 1998, 2001    s/p  cyst removal   . S/P Botox injection 2012    face    Past Surgical History  Procedure Laterality Date  . Ovarian cyst removal    . Tubal ligation  2004  . Breast surgery Bilateral 2000    implants    Family History  Problem Relation Age of Onset  . Hyperlipidemia Mother   . Hyperlipidemia Maternal Grandmother   . Heart disease Maternal Grandfather   . Heart disease Paternal Grandmother   . Cancer Neg Hx    History  Substance Use Topics  . Smoking status: Never Smoker   . Smokeless tobacco: Never Used  . Alcohol Use: No   OB History    No data available     Review of Systems  Gastrointestinal: Positive for nausea, vomiting, abdominal pain, diarrhea and blood in stool.  All other systems reviewed and are negative.     Allergies  Morphine and related  Home Medications   Prior to Admission medications   Medication Sig Start Date End Date Taking? Authorizing Provider  dicyclomine (BENTYL) 20 MG tablet Take 1 tablet (20 mg total) by mouth 2 (two) times  daily. 03/07/14  Yes Tanna Furry, MD  diphenoxylate-atropine (LOMOTIL) 2.5-0.025 MG per tablet Take 1 tablet by mouth 4 (four) times daily as needed for diarrhea or loose stools. 03/07/14  Yes Tanna Furry, MD  ondansetron (ZOFRAN ODT) 4 MG disintegrating tablet Take 1 tablet (4 mg total) by mouth every 8 (eight) hours as needed for nausea. 03/07/14  Yes Tanna Furry, MD  promethazine (PHENERGAN) 25 MG suppository Place 1 suppository (25 mg total) rectally every 6 (six) hours as needed for nausea or vomiting. 03/09/14   Kawena Lyday L Shloimy Michalski, PA-C   BP 109/51 mmHg  Pulse 62  Temp(Src) 98.3 F (36.8 C) (Oral)  Resp 16  Ht 5\' 7"  (1.702 m)  Wt 155 lb (70.308 kg)  BMI 24.27 kg/m2  SpO2 100%  LMP 02/24/2014 Physical Exam  Constitutional: She is oriented to person, place, and time. She appears well-developed and well-nourished. No distress.  HENT:  Head: Normocephalic and atraumatic.  Right Ear: External ear normal.  Left Ear: External ear normal.  Nose: Nose normal.  Mouth/Throat: Uvula is midline, oropharynx is clear and moist and mucous membranes are normal. No oropharyngeal exudate.  Eyes: Conjunctivae are normal.  Neck: Normal range of motion. Neck supple.  Cardiovascular: Normal rate, regular rhythm and normal heart sounds.   Pulmonary/Chest: Effort normal and breath sounds normal. No respiratory distress.  Abdominal: Soft. Bowel sounds are normal. She exhibits no distension. There is  no rebound and no guarding.  No abdominal tenderness with distraction.   Genitourinary: Rectum normal.  Brown stool on DRE  Musculoskeletal: Normal range of motion.  Neurological: She is alert and oriented to person, place, and time.  Skin: Skin is warm and dry. She is not diaphoretic.  Psychiatric: She has a normal mood and affect.  Nursing note and vitals reviewed.   ED Course  Procedures (including critical care time) Medications  ondansetron (ZOFRAN) injection 4 mg (4 mg Intravenous Given  03/09/14 1043)  sodium chloride 0.9 % bolus 1,000 mL (0 mLs Intravenous Stopped 03/09/14 1205)  famotidine (PEPCID) IVPB 20 mg (0 mg Intravenous Stopped 03/09/14 1125)    Labs Review Labs Reviewed  CBC WITH DIFFERENTIAL - Abnormal; Notable for the following:    Hemoglobin 10.0 (*)    HCT 32.7 (*)    MCV 75.0 (*)    MCH 22.9 (*)    RDW 17.6 (*)    Eosinophils Relative 10 (*)    All other components within normal limits  COMPREHENSIVE METABOLIC PANEL - Abnormal; Notable for the following:    BUN 4 (*)    Total Bilirubin <0.2 (*)    All other components within normal limits  URINALYSIS, ROUTINE W REFLEX MICROSCOPIC - Abnormal; Notable for the following:    Color, Urine COLORLESS (*)    Specific Gravity, Urine 1.002 (*)    All other components within normal limits  STOOL CULTURE  CLOSTRIDIUM DIFFICILE BY PCR  LIPASE, BLOOD  OCCULT BLOOD X 1 CARD TO LAB, STOOL  TROPONIN I    Imaging Review No results found.   EKG Interpretation None      MDM   Final diagnoses:  Nausea vomiting and diarrhea    Filed Vitals:   03/09/14 1238  BP: 109/51  Pulse: 62  Temp: 98.3 F (36.8 C)  Resp: 16    Afebrile, NAD, non-toxic appearing, AAOx4.  I have reviewed nursing notes, vital signs, and all appropriate lab and imaging results for this patient.  Patient with symptoms consistent with viral gastroenteritis.  Vitals are stable, no fever.  No signs of dehydration, tolerating PO fluids > 6 oz.  Lungs are clear.  No focal abdominal pain, no concern for appendicitis, cholecystitis, pancreatitis, ruptured viscus, UTI, kidney stone, or any other abdominal etiology.  No emesis or diarrhea in ED. Hemoccult negative. Hemoglobin and Hematocrit stable. Hematemesis likely secondary to esophageal irritation or Red Gatorade. Supportive therapy indicated with return if symptoms worsen.  Patient counseled. Patient is stable at time of discharge     Harlow Mares, PA-C 03/09/14  [place], MD 03/10/14 [phone]

## 2014-03-09 NOTE — ED Notes (Signed)
PA at bedside.

## 2014-03-09 NOTE — ED Notes (Signed)
C/o vomiting blood one hour pta- reports she was eval here Saturday for n/v

## 2014-03-09 NOTE — Discharge Instructions (Signed)
Please follow up with your primary care physician in 1-2 days. If you do not have one please call the [facility] number listed above. Please use return to the lab here or have your primary care doctor check your stool for possible parasite or other infectious sources of your symptoms. Please use Phenergan suppositories as prescribed if you are not tolerating your Zofran. You may take your Lomotil up to four times a day as prescribed at the previous visit. Please read all discharge instructions and return precautions.   Viral Gastroenteritis Viral gastroenteritis is also known as stomach flu. This condition affects the stomach and intestinal tract. It can cause sudden diarrhea and vomiting. The illness typically lasts 3 to 8 days. Most people develop an immune response that eventually gets rid of the virus. While this natural response develops, the virus can make you quite ill. CAUSES  Many different viruses can cause gastroenteritis, such as rotavirus or noroviruses. You can catch one of these viruses by consuming contaminated food or water. You may also catch a virus by sharing utensils or other personal items with an infected person or by touching a contaminated surface. SYMPTOMS  The most common symptoms are diarrhea and vomiting. These problems can cause a severe loss of body fluids (dehydration) and a body salt (electrolyte) imbalance. Other symptoms may include:  Fever.  Headache.  Fatigue.  Abdominal pain. DIAGNOSIS  Your caregiver can usually diagnose viral gastroenteritis based on your symptoms and a physical exam. A stool sample may also be taken to test for the presence of viruses or other infections. TREATMENT  This illness typically goes away on its own. Treatments are aimed at rehydration. The most serious cases of viral gastroenteritis involve vomiting so severely that you are not able to keep fluids down. In these cases, fluids must be given through an  intravenous line (IV). HOME CARE INSTRUCTIONS   Drink enough fluids to keep your urine clear or pale yellow. Drink small amounts of fluids frequently and increase the amounts as tolerated.  Ask your caregiver for specific rehydration instructions.  Avoid:  Foods high in sugar.  Alcohol.  Carbonated drinks.  Tobacco.  Juice.  Caffeine drinks.  Extremely hot or cold fluids.  Fatty, greasy foods.  Too much intake of anything at one time.  Dairy products until 24 to 48 hours after diarrhea stops.  You may consume probiotics. Probiotics are active cultures of beneficial bacteria. They may lessen the amount and number of diarrheal stools in adults. Probiotics can be found in yogurt with active cultures and in supplements.  Wash your hands well to avoid spreading the virus.  Only take over-the-counter or prescription medicines for pain, discomfort, or fever as directed by your caregiver. Do not give aspirin to children. Antidiarrheal medicines are not recommended.  Ask your caregiver if you should continue to take your regular prescribed and over-the-counter medicines.  Keep all follow-up appointments as directed by your caregiver. SEEK IMMEDIATE MEDICAL CARE IF:   You are unable to keep fluids down.  You do not urinate at least once every 6 to 8 hours.  You develop shortness of breath.  You notice blood in your stool or vomit. This may look like coffee grounds.  You have abdominal pain that increases or is concentrated in one small area (localized).  You have persistent vomiting or diarrhea.  You have a fever.  The patient is a child younger than 3 months, and he or she has a fever.  The patient is a child older than 3 months, and he or she has a fever and persistent symptoms.  The patient is a child older than 3 months, and he or she has a fever and symptoms suddenly get worse.  The patient is a baby, and he or she has no tears when crying. MAKE SURE YOU:    Understand these instructions.  Will watch your condition.  Will get help right away if you are not doing well or get worse. Document Released: 03/13/2005 Document Revised: 06/05/2011 Document Reviewed: 12/28/2010 [facility] Patient Information 2015 [place], [place]. This information is not intended to replace advice given to you by your health care provider. Make sure you discuss any questions you have with your health care provider.

## 2014-03-11 ENCOUNTER — Emergency Department (HOSPITAL_COMMUNITY)
Admission: EM | Admit: 2014-03-11 | Discharge: 2014-03-11 | Disposition: A | Discharge status: Home / Self Care | Payer: Medicaid Other | Attending: Emergency Medicine | Admitting: Emergency Medicine

## 2014-03-11 ENCOUNTER — Encounter (HOSPITAL_COMMUNITY): Payer: Self-pay | Admitting: Emergency Medicine

## 2014-03-11 DIAGNOSIS — Z8639 Personal history of other endocrine, nutritional and metabolic disease: Secondary | ICD-10-CM | POA: Diagnosis not present

## 2014-03-11 DIAGNOSIS — Y9289 Other specified places as the place of occurrence of the external cause: Secondary | ICD-10-CM | POA: Insufficient documentation

## 2014-03-11 DIAGNOSIS — T783XXA Angioneurotic edema, initial encounter: Secondary | ICD-10-CM | POA: Diagnosis not present

## 2014-03-11 DIAGNOSIS — Y998 Other external cause status: Secondary | ICD-10-CM | POA: Diagnosis not present

## 2014-03-11 DIAGNOSIS — X58XXXA Exposure to other specified factors, initial encounter: Secondary | ICD-10-CM | POA: Diagnosis not present

## 2014-03-11 DIAGNOSIS — R112 Nausea with vomiting, unspecified: Secondary | ICD-10-CM | POA: Diagnosis present

## 2014-03-11 DIAGNOSIS — Z79899 Other long term (current) drug therapy: Secondary | ICD-10-CM | POA: Insufficient documentation

## 2014-03-11 DIAGNOSIS — Z8742 Personal history of other diseases of the female genital tract: Secondary | ICD-10-CM | POA: Insufficient documentation

## 2014-03-11 DIAGNOSIS — Z3202 Encounter for pregnancy test, result negative: Secondary | ICD-10-CM | POA: Diagnosis not present

## 2014-03-11 DIAGNOSIS — A084 Viral intestinal infection, unspecified: Secondary | ICD-10-CM | POA: Diagnosis not present

## 2014-03-11 DIAGNOSIS — Y9389 Activity, other specified: Secondary | ICD-10-CM | POA: Insufficient documentation

## 2014-03-11 LAB — COMPREHENSIVE METABOLIC PANEL
ALBUMIN: 4 g/dL (ref 3.5–5.2)
ALK PHOS: 70 U/L (ref 39–117)
ALT: 13 U/L (ref 0–35)
AST: 17 U/L (ref 0–37)
Anion gap: 13 (ref 5–15)
BUN: 5 mg/dL — ABNORMAL LOW (ref 6–23)
CO2: 25 mEq/L (ref 19–32)
Calcium: 9.8 mg/dL (ref 8.4–10.5)
Chloride: 99 mEq/L (ref 96–112)
Creatinine, Ser: 0.55 mg/dL (ref 0.50–1.10)
GFR calc non Af Amer: >90 mL/min (ref >90)
Glucose, Bld: 85 mg/dL (ref 70–99)
POTASSIUM: 3.8 meq/L (ref 3.7–5.3)
Sodium: 137 mEq/L (ref 137–147)
Total Bilirubin: <0.2 mg/dL — ABNORMAL LOW (ref 0.3–1.2)
Total Protein: 7.9 g/dL (ref 6.0–8.3)

## 2014-03-11 LAB — CBC WITH DIFFERENTIAL/PLATELET
BASOS ABS: 0.1 10*3/uL (ref 0.0–0.1)
BASOS PCT: 1 % (ref 0–1)
Eosinophils Absolute: 0.6 10*3/uL (ref 0.0–0.7)
Eosinophils Relative: 7 % — ABNORMAL HIGH (ref 0–5)
HCT: 35.8 % — ABNORMAL LOW (ref 36.0–46.0)
HEMOGLOBIN: 10.8 g/dL — AB (ref 12.0–15.0)
LYMPHS PCT: 29 % (ref 12–46)
Lymphs Abs: 2.4 10*3/uL (ref 0.7–4.0)
MCH: 22.6 pg — AB (ref 26.0–34.0)
MCHC: 30.2 g/dL (ref 30.0–36.0)
MCV: 74.9 fL — ABNORMAL LOW (ref 78.0–100.0)
MONO ABS: 0.3 10*3/uL (ref 0.1–1.0)
Monocytes Relative: 4 % (ref 3–12)
NEUTROS PCT: 59 % (ref 43–77)
Neutro Abs: 5 10*3/uL (ref 1.7–7.7)
PLATELETS: 279 10*3/uL (ref 150–400)
RBC: 4.78 MIL/uL (ref 3.87–5.11)
RDW: 16.9 % — AB (ref 11.5–15.5)
WBC: 8.4 10*3/uL (ref 4.0–10.5)

## 2014-03-11 LAB — LIPASE, BLOOD: Lipase: 37 U/L (ref 11–59)

## 2014-03-11 LAB — POC URINE PREG, ED: Preg Test, Ur: NEGATIVE

## 2014-03-11 MED ORDER — FENTANYL CITRATE 0.05 MG/ML IJ SOLN
50.0000 ug | Freq: Once | INTRAMUSCULAR | Status: AC
  Administered 2014-03-11: 50 ug via INTRAVENOUS
  Filled 2014-03-11: qty 2

## 2014-03-11 MED ORDER — ONDANSETRON HCL 4 MG/2ML IJ SOLN
4.0000 mg | Freq: Once | INTRAMUSCULAR | Status: AC
  Administered 2014-03-11: 4 mg via INTRAVENOUS
  Filled 2014-03-11: qty 2

## 2014-03-11 MED ORDER — PREDNISONE 20 MG PO TABS: ORAL_TABLET | ORAL | Status: DC

## 2014-03-11 MED ORDER — ONDANSETRON 4 MG PO TBDP: 4.0000 mg | ORAL_TABLET | Freq: Three times a day (TID) | ORAL | Status: DC | PRN

## 2014-03-11 MED ORDER — SODIUM CHLORIDE 0.9 % IV BOLUS (SEPSIS)
1000.0000 mL | Freq: Once | INTRAVENOUS | Status: AC
  Administered 2014-03-11: 1000 mL via INTRAVENOUS

## 2014-03-11 NOTE — ED Notes (Signed)
Pt states that she has been having NVD since last Friday.  Has been twice to ER.  States that it hasn't gotten better.  Mid abd pain.

## 2014-03-11 NOTE — ED Provider Notes (Signed)
CSN: [id number]     Arrival date & time 03/11/14  0800 History   First MD Initiated Contact with Patient 03/11/14 0809     Chief Complaint  Patient presents with  . Diarrhea  . Nausea  . Emesis    HPI  Alexandria Herrera is a 43 year old female who presents with a 5-day history of diarrhea/vomiting/nausea. She reports having diarrhea 3-5 times/minute but has now progressed to vomiting blood which she quantifies as spoonfuls as of yesterday. She also has mid-epigastric abdominal pain, myalgia, fever, and cannot tolerate PO intake. She denies dysuria, sick contacts. She works as an Conservation officer, nature and is concerned she may have picked up something there and has received her flu shot.   Past Medical History  Diagnosis Date  . Hyperlipemia 1996  . Ovarian cyst 1998, 2001    s/p  cyst removal   . S/P Botox injection 2012    face    Past Surgical History  Procedure Laterality Date  . Ovarian cyst removal    . Tubal ligation  2004  . Breast surgery Bilateral 2000    implants    Family History  Problem Relation Age of Onset  . Hyperlipidemia Mother   . Hyperlipidemia Maternal Grandmother   . Heart disease Maternal Grandfather   . Heart disease Paternal Grandmother   . Cancer Neg Hx    History  Substance Use Topics  . Smoking status: Never Smoker   . Smokeless tobacco: Never Used  . Alcohol Use: No   OB History    No data available     Review of Systems  Constitutional: Positive for fever.  Respiratory: Positive for shortness of breath.   Gastrointestinal: Positive for nausea, vomiting, abdominal pain and diarrhea.  Genitourinary: Negative for dysuria.      Allergies  Morphine and related  Home Medications   Prior to Admission medications   Medication Sig Start Date End Date Taking? Authorizing Provider  dicyclomine (BENTYL) 20 MG tablet Take 1 tablet (20 mg total) by mouth 2 (two) times daily. 03/07/14  Yes Tanna Furry, MD  diphenoxylate-atropine (LOMOTIL) 2.5-0.025  MG per tablet Take 1 tablet by mouth 4 (four) times daily as needed for diarrhea or loose stools. 03/07/14  Yes Tanna Furry, MD  ondansetron (ZOFRAN ODT) 4 MG disintegrating tablet Take 1 tablet (4 mg total) by mouth every 8 (eight) hours as needed for nausea. 03/11/14   Charlott Rakes, MD  predniSONE (DELTASONE) 20 MG tablet Please take 3 tablets today (60mg ) and 2 tablets tomorrow (40mg ) and the day after. 03/11/14   Charlott Rakes, MD   BP 119/65 mmHg  Pulse 76  Temp(Src) 97.8 F (36.6 C) (Oral)  Resp 18  SpO2 100%  LMP 02/24/2014 Physical Exam  Constitutional: She is oriented to person, place, and time. She appears well-developed and well-nourished. No distress.  HENT:  Head: Normocephalic and atraumatic.  Mouth/Throat: Oropharynx is clear and moist. No oropharyngeal exudate.  Lip swelling.  Eyes: Conjunctivae are normal. Pupils are equal, round, and reactive to light.  Cardiovascular: Normal rate and regular rhythm.  Exam reveals no gallop and no friction rub.   No murmur heard. Pulmonary/Chest: Effort normal and breath sounds normal. No respiratory distress. She has no wheezes. She has no rales.  Abdominal: Soft. Bowel sounds are normal. She exhibits no distension. There is tenderness. There is no rebound.  Mild tenderness to deep palpation over epigastric area  Neurological: She is alert and oriented to person, place, and time.  No cranial nerve deficit.  Skin: She is not diaphoretic.    ED Course  Procedures (including critical care time) Labs Review Labs Reviewed  CBC WITH DIFFERENTIAL - Abnormal; Notable for the following:    Hemoglobin 10.8 (*)    HCT 35.8 (*)    MCV 74.9 (*)    MCH 22.6 (*)    RDW 16.9 (*)    Eosinophils Relative 7 (*)    All other components within normal limits  COMPREHENSIVE METABOLIC PANEL - Abnormal; Notable for the following:    BUN 5 (*)    Total Bilirubin <0.2 (*)    All other components within normal limits  LIPASE, BLOOD  POC URINE PREG, ED     Imaging Review No results found.   EKG Interpretation None      MDM   Final diagnoses:  Viral gastroenteritis  Angioedema of lips, initial encounter    Suspect viral gastroenteritis despite this being her third ED visit since Friday. Symptoms inconsistent with objective findings. Plan to treat with IV fluids, Zofran, Fentanyl for pain.  959AM: Hb stable at 10. Advised her to follow-up with PCP though she reports they are out-of-town and would like to be seen by GI specialist. Will provide with her information thought recommended she still see PCP first. Prescribed Zofran as well along with prednisone for her lip swelling which she reports is chronic.  Charlott Rakes, MD 03/11/14 [place] Alvino Chapel, MD 03/12/14 [phone]

## 2014-03-11 NOTE — ED Notes (Signed)
Pt given a cup of sprite for PO challenge

## 2014-03-11 NOTE — Discharge Instructions (Signed)

## 2014-03-25 ENCOUNTER — Encounter: Payer: Self-pay | Admitting: Internal Medicine

## 2014-04-21 ENCOUNTER — Encounter (HOSPITAL_BASED_OUTPATIENT_CLINIC_OR_DEPARTMENT_OTHER): Payer: Self-pay

## 2014-04-21 ENCOUNTER — Emergency Department (HOSPITAL_BASED_OUTPATIENT_CLINIC_OR_DEPARTMENT_OTHER)
Admission: EM | Admit: 2014-04-21 | Discharge: 2014-04-21 | Disposition: A | Discharge status: Home / Self Care | Payer: Worker's Compensation | Attending: Emergency Medicine | Admitting: Emergency Medicine

## 2014-04-21 ENCOUNTER — Emergency Department (HOSPITAL_BASED_OUTPATIENT_CLINIC_OR_DEPARTMENT_OTHER): Payer: Worker's Compensation

## 2014-04-21 DIAGNOSIS — Z8639 Personal history of other endocrine, nutritional and metabolic disease: Secondary | ICD-10-CM | POA: Diagnosis not present

## 2014-04-21 DIAGNOSIS — S0990XA Unspecified injury of head, initial encounter: Secondary | ICD-10-CM

## 2014-04-21 DIAGNOSIS — Y9289 Other specified places as the place of occurrence of the external cause: Secondary | ICD-10-CM | POA: Diagnosis not present

## 2014-04-21 DIAGNOSIS — Y9389 Activity, other specified: Secondary | ICD-10-CM | POA: Insufficient documentation

## 2014-04-21 DIAGNOSIS — Y990 Civilian activity done for income or pay: Secondary | ICD-10-CM | POA: Diagnosis not present

## 2014-04-21 DIAGNOSIS — S39012A Strain of muscle, fascia and tendon of lower back, initial encounter: Secondary | ICD-10-CM | POA: Diagnosis not present

## 2014-04-21 DIAGNOSIS — Z79899 Other long term (current) drug therapy: Secondary | ICD-10-CM | POA: Insufficient documentation

## 2014-04-21 DIAGNOSIS — S6391XA Sprain of unspecified part of right wrist and hand, initial encounter: Secondary | ICD-10-CM | POA: Diagnosis not present

## 2014-04-21 DIAGNOSIS — Z8742 Personal history of other diseases of the female genital tract: Secondary | ICD-10-CM | POA: Diagnosis not present

## 2014-04-21 DIAGNOSIS — W1839XA Other fall on same level, initial encounter: Secondary | ICD-10-CM | POA: Diagnosis not present

## 2014-04-21 MED ORDER — OXYCODONE-ACETAMINOPHEN 5-325 MG PO TABS: 1.0000 | ORAL_TABLET | ORAL | Status: DC | PRN

## 2014-04-21 MED ORDER — IBUPROFEN 600 MG PO TABS: 600.0000 mg | ORAL_TABLET | Freq: Four times a day (QID) | ORAL | Status: DC | PRN

## 2014-04-21 MED ORDER — ACETAMINOPHEN 325 MG PO TABS
650.0000 mg | ORAL_TABLET | Freq: Once | ORAL | Status: AC
  Administered 2014-04-21: 650 mg via ORAL
  Filled 2014-04-21: qty 2

## 2014-04-21 NOTE — ED Provider Notes (Signed)
CSN: [id number]     Arrival date & time 04/21/14  2947 History   First MD Initiated Contact with Patient 04/21/14 [phone]     Chief Complaint  Patient presents with  . Back Pain     Patient is a 44 y.o. female presenting with fall. The history is provided by the patient.  Fall This is a new problem. Episode onset: just prior to arrival. The problem occurs constantly. Associated symptoms include headaches. Pertinent negatives include no chest pain and no abdominal pain. Exacerbated by: movement. The symptoms are relieved by rest and NSAIDs. Treatments tried: ibuprofen. The treatment provided mild relief.  Patient reports she slipped on ice while walking at work just prior to arrival. She reports she fell, landing on her low back.  She also hit the back of her head.  She also reports hitting her right hand on ground.  No LOC.  She reports only mild head pain.  She reports low back pain but no leg weakness is reported. No CP. No neck pain. No abdominal pain. She reports she was given ibuprofen at work.   Past Medical History  Diagnosis Date  . Hyperlipemia 1996  . Ovarian cyst 1998, 2001    s/p  cyst removal   . S/P Botox injection 2012    face    Past Surgical History  Procedure Laterality Date  . Ovarian cyst removal    . Tubal ligation  2004  . Breast surgery Bilateral 2000    implants    Family History  Problem Relation Age of Onset  . Hyperlipidemia Mother   . Hyperlipidemia Maternal Grandmother   . Heart disease Maternal Grandfather   . Heart disease Paternal Grandmother   . Cancer Neg Hx    History  Substance Use Topics  . Smoking status: Never Smoker   . Smokeless tobacco: Never Used  . Alcohol Use: No   OB History    No data available     Review of Systems  Cardiovascular: Negative for chest pain.  Gastrointestinal: Negative for vomiting and abdominal pain.  Musculoskeletal: Positive for back pain and arthralgias. Negative for neck pain.  Neurological:  Positive for headaches. Negative for weakness.  All other systems reviewed and are negative.     Allergies  Morphine and related  Home Medications   Prior to Admission medications   Medication Sig Start Date End Date Taking? Authorizing Provider  dicyclomine (BENTYL) 20 MG tablet Take 1 tablet (20 mg total) by mouth 2 (two) times daily. 03/07/14   Tanna Furry, MD  diphenoxylate-atropine (LOMOTIL) 2.5-0.025 MG per tablet Take 1 tablet by mouth 4 (four) times daily as needed for diarrhea or loose stools. 03/07/14   Tanna Furry, MD  ondansetron (ZOFRAN ODT) 4 MG disintegrating tablet Take 1 tablet (4 mg total) by mouth every 8 (eight) hours as needed for nausea. 03/11/14   Charlott Rakes, MD  predniSONE (DELTASONE) 20 MG tablet Please take 3 tablets today (60mg ) and 2 tablets tomorrow (40mg ) and the day after. 03/11/14   Charlott Rakes, MD   BP 105/66 mmHg  Pulse 85  Temp(Src) 97.4 F (36.3 C) (Oral)  Resp 16  Ht 5\' 7"  (1.702 m)  Wt 140 lb (63.504 kg)  BMI 21.92 kg/m2  SpO2 100%  LMP 03/21/2014 Physical Exam CONSTITUTIONAL: Well developed/well nourished HEAD: mild tenderness to posterior occiput. No stepoffs noted EYES: EOMI/PERRL ENMT: Mucous membranes moist NECK: supple no meningeal signs SPINE/BACK:no cervical/thoracic tenderness.  She has lumbar tenderness.  No  bruising/crepitance/stepoffs noted to spine CV: S1/S2 noted, no murmurs/rubs/gallops noted LUNGS: Lungs are clear to auscultation bilaterally, no apparent distress ABDOMEN: soft, nontender, no rebound or guarding GU:no cva tenderness, no bruising noted to flank NEURO: Awake/alert, equal motor 5/5 strength noted with the following: hip flexion/knee flexion/extension, foot dorsi/plantar flexion, great toe extension intact bilaterally, no clonus bilaterally, plantar reflex appropriate (toes downgoing),Equal patellar/achilles reflex noted (2+) in bilateral lower extremities.  Pt is able to ambulate unassisted. EXTREMITIES:  pulses normal, full ROM. Tenderness to palpation of lateral aspect of right hand.  She can fully range right wrist.  All other extremities/joints palpated/ranged and nontender SKIN: warm, color normal PSYCH: no abnormalities of mood noted, alert and oriented to situation   ED Course  Procedures   7:39 AM Will perform imaging of low back and right hand.  She reports hitting her head but no LOC, only minimal headache and no vomiting, defer Ct head for now.  I doubt acute intracranial injury at this time  DISCUSSED IMAGING RESULTS ADVISED TO F/U WITH HER PERSONAL PHYSICIAN OR OCCUPATION PHYSICIAN FOR FURTHER PT REPORTS SHE CAN TAKE PERCOCET FOR PAIN  Imaging Review Dg Lumbar Spine Complete  04/21/2014   CLINICAL DATA:  Acute lower back pain after falling on ice today.  EXAM: LUMBAR SPINE - COMPLETE 4+ VIEW  COMPARISON:  None.  FINDINGS: There is no evidence of lumbar spine fracture. Alignment is normal. Intervertebral disc spaces are maintained.  IMPRESSION: Normal lumbar spine.   Electronically Signed   By: Sabino Dick M.D.   On: 04/21/2014 08:00   Dg Hand Complete Right  04/21/2014   CLINICAL DATA:  44 year old with tenderness in the right hand after falling on ice approximately 40 min ago.  EXAM: RIGHT HAND - COMPLETE 3+ VIEW  COMPARISON:  None.  FINDINGS: Negative for a fracture or dislocation. There is mild joint space narrowing at the radiocarpal joint with mild subchondral sclerosis in the distal radius. Carpal bones are intact. Soft tissues are unremarkable.  IMPRESSION: No acute bone abnormality in the right hand.   Electronically Signed   By: Markus Daft M.D.   On: 04/21/2014 07:59     Medications  acetaminophen (TYLENOL) tablet 650 mg (650 mg Oral Given 04/21/14 0747)    MDM   Final diagnoses:  Sprain of right hand, initial encounter  Minor head injury without loss of consciousness, initial encounter  Lumbar strain, initial encounter    Nursing notes including past medical  history and social history reviewed and considered in documentation xrays/imaging reviewed by myself and considered during evaluation     Sharyon Cable, MD 04/21/14 [phone]

## 2014-04-21 NOTE — ED Notes (Signed)
Pt reports lower back pain since yesterday. Sts she was walking at work. Pt ambulatory to room 6. Denies injury. Denies urinary symptoms.

## 2014-04-21 NOTE — Discharge Instructions (Signed)
SEEK IMMEDIATE MEDICAL ATTENTION IF: New numbness, tingling, weakness, or problem with the use of your arms or legs.  Severe back pain not relieved with medications.  Change in bowel or bladder control (if you lose control of stool or urine, or if you are unable to urinate) Increasing pain in any areas of the body (such as chest or abdominal pain).  Shortness of breath, dizziness or fainting.  Nausea (feeling sick to your stomach), vomiting, fever, or sweats.  You have had a head injury which does not appear to require admission at this time. A concussion is a state of changed mental ability from trauma.  SEEK IMMEDIATE MEDICAL ATTENTION IF: There is confusion or drowsiness (although children frequently become drowsy after injury).  You cannot awaken the injured person.  There is nausea (feeling sick to your stomach) or continued, forceful vomiting.  You notice dizziness or unsteadiness which is getting worse, or inability to walk.  You have convulsions or unconsciousness.  You experience severe, persistent headaches not relieved by Tylenol. (Do not take aspirin as this impairs clotting abilities). Take other pain medications only as directed.  You cannot use arms or legs normally.  There are changes in pupil sizes. (This is the black center in the colored part of the eye)  There is clear or bloody discharge from the nose or ears.  Change in speech, vision, swallowing, or understanding.  Localized weakness, numbness, tingling, or change in bowel or bladder control.

## 2014-04-21 NOTE — ED Notes (Signed)
MD at bedside. 

## 2014-07-07 ENCOUNTER — Emergency Department (HOSPITAL_BASED_OUTPATIENT_CLINIC_OR_DEPARTMENT_OTHER)
Admission: EM | Admit: 2014-07-07 | Discharge: 2014-07-08 | Disposition: A | Discharge status: Home / Self Care | Payer: Medicaid Other | Attending: Emergency Medicine | Admitting: Emergency Medicine

## 2014-07-07 ENCOUNTER — Encounter (HOSPITAL_BASED_OUTPATIENT_CLINIC_OR_DEPARTMENT_OTHER): Payer: Self-pay | Admitting: *Deleted

## 2014-07-07 DIAGNOSIS — S025XXA Fracture of tooth (traumatic), initial encounter for closed fracture: Secondary | ICD-10-CM | POA: Insufficient documentation

## 2014-07-07 DIAGNOSIS — Y9289 Other specified places as the place of occurrence of the external cause: Secondary | ICD-10-CM | POA: Insufficient documentation

## 2014-07-07 DIAGNOSIS — X58XXXA Exposure to other specified factors, initial encounter: Secondary | ICD-10-CM | POA: Diagnosis not present

## 2014-07-07 DIAGNOSIS — Y9389 Activity, other specified: Secondary | ICD-10-CM | POA: Diagnosis not present

## 2014-07-07 DIAGNOSIS — Y998 Other external cause status: Secondary | ICD-10-CM | POA: Insufficient documentation

## 2014-07-07 DIAGNOSIS — Z8639 Personal history of other endocrine, nutritional and metabolic disease: Secondary | ICD-10-CM | POA: Diagnosis not present

## 2014-07-07 DIAGNOSIS — S0993XA Unspecified injury of face, initial encounter: Secondary | ICD-10-CM | POA: Diagnosis present

## 2014-07-07 DIAGNOSIS — Z8742 Personal history of other diseases of the female genital tract: Secondary | ICD-10-CM | POA: Insufficient documentation

## 2014-07-07 MED ORDER — BUPIVACAINE-EPINEPHRINE (PF) 0.5% -1:200000 IJ SOLN
1.8000 mL | Freq: Once | INTRAMUSCULAR | Status: AC
Start: 2014-07-07 — End: 2014-07-07
  Administered 2014-07-07: 1.8 mL
  Filled 2014-07-07: qty 1.8

## 2014-07-07 MED ORDER — BUPIVACAINE HCL (PF) 0.5 % IJ SOLN: 20.0000 mL | Freq: Once | INTRAMUSCULAR | Status: DC

## 2014-07-07 NOTE — ED Provider Notes (Signed)
CSN: [id number]     Arrival date & time 07/07/14  2117 History  This chart was scribed for Alexandria Rosser, MD by Tula Nakayama, ED Scribe. This patient was seen in room MH02/MH02 and the patient's care was started at 11:22 PM.    Chief Complaint  Patient presents with  . Dental Pain   The history is provided by the patient. No language interpreter was used.    HPI Comments: Alexandria Herrera is a 44 y.o. female who presents to the Emergency Department complaining of pain in her right lower first molar. She reports that she fractured that tooth 2 days ago. Associated pain had been tolerable but worsened this evening and is now moderate to severe. It is worse with drinking and eating. Pt does not have a dentist.  Past Medical History  Diagnosis Date  . Hyperlipemia 1996  . Ovarian cyst 1998, 2001    s/p  cyst removal   . S/P Botox injection 2012    face    Past Surgical History  Procedure Laterality Date  . Ovarian cyst removal    . Tubal ligation  2004  . Breast surgery Bilateral 2000    implants    Family History  Problem Relation Age of Onset  . Hyperlipidemia Mother   . Hyperlipidemia Maternal Grandmother   . Heart disease Maternal Grandfather   . Heart disease Paternal Grandmother   . Cancer Neg Hx    History  Substance Use Topics  . Smoking status: Never Smoker   . Smokeless tobacco: Never Used  . Alcohol Use: No   OB History    No data available     Review of Systems  10 Systems reviewed and all are negative for acute change except as noted in the HPI.  Allergies  Morphine and related  Home Medications   Prior to Admission medications   Medication Sig Start Date End Date Taking? Authorizing Provider  clindamycin (CLEOCIN) 150 MG capsule Take 1 capsule (150 mg total) by mouth 3 (three) times daily. 07/08/14   Dishawn Bhargava, MD  HYDROcodone-acetaminophen (NORCO) 5-325 MG per tablet Take 1-2 tablets by mouth every 6 (six) hours as needed (for pain). 07/08/14   Shanikka Wonders, MD  ibuprofen (ADVIL,MOTRIN) 600 MG tablet Take 1 tablet (600 mg total) by mouth every 6 (six) hours as needed. 04/21/14   Ripley Fraise, MD  oxyCODONE-acetaminophen (PERCOCET/ROXICET) 5-325 MG per tablet Take 1 tablet by mouth every 4 (four) hours as needed for severe pain. 04/21/14   Ripley Fraise, MD   BP 100/56 mmHg  Pulse 78  Temp(Src) 98.4 F (36.9 C) (Oral)  Resp 18  Ht 5\' 7"  (1.702 m)  Wt 140 lb (63.504 kg)  BMI 21.92 kg/m2  SpO2 100%  LMP 06/06/2014   Physical Exam  Nursing note and vitals reviewed.  General: Well-developed, well-nourished female in no acute distress; appearance consistent with age of record HENT: normocephalic; atraumatic; acutely fractured buccal side of right lower 1st molar with old amalgam filling in place, tender to percussion Eyes: pupils equal, round and reactive to light; extraocular muscles intact Neck: supple Heart: regular rate and rhythm Lungs: normal respiratory effort and excursion Abdomen: soft; nondistended Extremities: No deformity; full range of motion Neurologic: Awake, alert and oriented; motor function intact in all extremities and symmetric; no facial droop Skin: Warm and dry Psychiatric: flat affect  ED Course  Procedures   DIAGNOSTIC STUDIES: Oxygen Saturation is 100% on RA, normal by my interpretation.  COORDINATION OF CARE: 11:24 PM Discussed treatment plan with pt. Pt agreed to plan.  DENTAL BLOCK 1.8 milliliters of 0.5% bupivacaine with epinephrine were injected into the buccal fold adjacent to the right lower first molar. The patient tolerated this well and there were no immediate complications. Adequate analgesia was not obtained.  Will refer to dentist on call (Dr. Radford Pax).  MDM   Final diagnoses:  Broken tooth, closed, initial encounter   I personally performed the services described in this documentation, which was scribed in my presence. The recorded information has been reviewed and is  accurate.   Alexandria Rosser, MD 07/08/14 [phone]

## 2014-07-07 NOTE — ED Notes (Signed)
Dental pain today.

## 2014-07-08 MED ORDER — CLINDAMYCIN HCL 150 MG PO CAPS
300.0000 mg | ORAL_CAPSULE | Freq: Once | ORAL | Status: AC
  Administered 2014-07-08: 300 mg via ORAL
  Filled 2014-07-08: qty 2

## 2014-07-08 MED ORDER — HYDROCODONE-ACETAMINOPHEN 5-325 MG PO TABS
1.0000 | ORAL_TABLET | Freq: Once | ORAL | Status: AC
  Administered 2014-07-08: 1 via ORAL
  Filled 2014-07-08: qty 1

## 2014-07-08 MED ORDER — CLINDAMYCIN HCL 150 MG PO CAPS: 150.0000 mg | ORAL_CAPSULE | Freq: Three times a day (TID) | ORAL | Status: DC

## 2014-07-08 MED ORDER — HYDROCODONE-ACETAMINOPHEN 5-325 MG PO TABS: 1.0000 | ORAL_TABLET | Freq: Four times a day (QID) | ORAL | Status: DC | PRN

## 2014-07-08 NOTE — Discharge Instructions (Signed)
Dental Fracture You have a dental fracture or injury. This can mean the tooth is loose, has a chip in the enamel or is broken. If just the outer enamel is chipped, there is a good chance the tooth will not become infected. The only treatment needed may be to smooth off a rough edge. Fractures into the deeper layers (dentin and pulp) cause greater pain and are more likely to become infected. These require you to see a dentist as soon as possible to save the tooth. Loose teeth may need to be wired or bonded with a plastic splint to hold them in place. A paste may be painted on the open area of the broken tooth to reduce the pain. Antibiotics and pain medicine may be prescribed. Choosing a soft or liquid diet and rinsing the mouth out with warm water after meals may be helpful. See your dentist as recommended. Failure to seek care or follow up with a dentist or other specialist as recommended could result in the loss of your tooth, infection, or permanent dental problems. SEEK MEDICAL CARE IF:   You have increased pain not controlled with medicines.  You have swelling around the tooth, in the face or neck.  You have bleeding which starts, continues, or gets worse.  You have a fever. Document Released: 04/20/2004 Document Revised: 06/05/2011 Document Reviewed: 02/02/2009 [facility] Patient Information 2015 [place], [place]. This information is not intended to replace advice given to you by your health care provider. Make sure you discuss any questions you have with your health care provider.

## 2014-11-17 ENCOUNTER — Encounter (HOSPITAL_COMMUNITY): Payer: Self-pay | Admitting: *Deleted

## 2014-11-17 ENCOUNTER — Emergency Department (HOSPITAL_COMMUNITY): Payer: Medicaid Other

## 2014-11-17 ENCOUNTER — Emergency Department (HOSPITAL_COMMUNITY)
Admission: EM | Admit: 2014-11-17 | Discharge: 2014-11-17 | Disposition: A | Discharge status: Home / Self Care | Payer: Medicaid Other | Attending: Physician Assistant | Admitting: Physician Assistant

## 2014-11-17 DIAGNOSIS — R109 Unspecified abdominal pain: Secondary | ICD-10-CM | POA: Diagnosis present

## 2014-11-17 DIAGNOSIS — Z9851 Tubal ligation status: Secondary | ICD-10-CM | POA: Insufficient documentation

## 2014-11-17 DIAGNOSIS — Z3202 Encounter for pregnancy test, result negative: Secondary | ICD-10-CM | POA: Insufficient documentation

## 2014-11-17 DIAGNOSIS — Z792 Long term (current) use of antibiotics: Secondary | ICD-10-CM | POA: Diagnosis not present

## 2014-11-17 DIAGNOSIS — M79651 Pain in right thigh: Secondary | ICD-10-CM | POA: Insufficient documentation

## 2014-11-17 DIAGNOSIS — M549 Dorsalgia, unspecified: Secondary | ICD-10-CM

## 2014-11-17 DIAGNOSIS — Z8639 Personal history of other endocrine, nutritional and metabolic disease: Secondary | ICD-10-CM | POA: Diagnosis not present

## 2014-11-17 DIAGNOSIS — D251 Intramural leiomyoma of uterus: Secondary | ICD-10-CM | POA: Diagnosis not present

## 2014-11-17 DIAGNOSIS — R1031 Right lower quadrant pain: Secondary | ICD-10-CM

## 2014-11-17 LAB — CBC WITH DIFFERENTIAL/PLATELET
Basophils Absolute: 0.1 10*3/uL (ref 0.0–0.1)
Basophils Relative: 1 % (ref 0–1)
Eosinophils Absolute: 1.7 10*3/uL — ABNORMAL HIGH (ref 0.0–0.7)
Eosinophils Relative: 17 % — ABNORMAL HIGH (ref 0–5)
HCT: 30.9 % — ABNORMAL LOW (ref 36.0–46.0)
Hemoglobin: 9.3 g/dL — ABNORMAL LOW (ref 12.0–15.0)
Lymphocytes Relative: 29 % (ref 12–46)
Lymphs Abs: 2.9 10*3/uL (ref 0.7–4.0)
MCH: 21.6 pg — ABNORMAL LOW (ref 26.0–34.0)
MCHC: 30.1 g/dL (ref 30.0–36.0)
MCV: 71.7 fL — ABNORMAL LOW (ref 78.0–100.0)
Monocytes Absolute: 0.5 10*3/uL (ref 0.1–1.0)
Monocytes Relative: 5 % (ref 3–12)
Neutro Abs: 4.7 10*3/uL (ref 1.7–7.7)
Neutrophils Relative %: 48 % (ref 43–77)
Platelets: 340 10*3/uL (ref 150–400)
RBC: 4.31 MIL/uL (ref 3.87–5.11)
RDW: 18.2 % — ABNORMAL HIGH (ref 11.5–15.5)
WBC: 9.9 10*3/uL (ref 4.0–10.5)

## 2014-11-17 LAB — COMPREHENSIVE METABOLIC PANEL
ALT: 66 U/L — ABNORMAL HIGH (ref 14–54)
AST: 51 U/L — ABNORMAL HIGH (ref 15–41)
Albumin: 3.8 g/dL (ref 3.5–5.0)
Alkaline Phosphatase: 74 U/L (ref 38–126)
Anion gap: 3 — ABNORMAL LOW (ref 5–15)
BUN: 8 mg/dL (ref 6–20)
CO2: 26 mmol/L (ref 22–32)
Calcium: 9 mg/dL (ref 8.9–10.3)
Chloride: 106 mmol/L (ref 101–111)
Creatinine, Ser: 0.44 mg/dL (ref 0.44–1.00)
GFR calc Af Amer: >60 mL/min (ref >60)
GFR calc non Af Amer: >60 mL/min (ref >60)
Glucose, Bld: 95 mg/dL (ref 65–99)
Potassium: 4.1 mmol/L (ref 3.5–5.1)
Sodium: 135 mmol/L (ref 135–145)
Total Bilirubin: 0.3 mg/dL (ref 0.3–1.2)
Total Protein: 7.1 g/dL (ref 6.5–8.1)

## 2014-11-17 LAB — URINALYSIS, ROUTINE W REFLEX MICROSCOPIC
Bilirubin Urine: NEGATIVE
Glucose, UA: NEGATIVE mg/dL
Hgb urine dipstick: NEGATIVE
Ketones, ur: NEGATIVE mg/dL
Leukocytes, UA: NEGATIVE
Nitrite: NEGATIVE
Protein, ur: NEGATIVE mg/dL
Specific Gravity, Urine: 1.012 (ref 1.005–1.030)
Urobilinogen, UA: 0.2 mg/dL (ref 0.0–1.0)
pH: 7 (ref 5.0–8.0)

## 2014-11-17 LAB — LIPASE, BLOOD: Lipase: 32 U/L (ref 22–51)

## 2014-11-17 LAB — PREGNANCY, URINE: Preg Test, Ur: NEGATIVE

## 2014-11-17 MED ORDER — KETOROLAC TROMETHAMINE 30 MG/ML IJ SOLN
30.0000 mg | Freq: Once | INTRAMUSCULAR | Status: AC
  Administered 2014-11-17: 30 mg via INTRAVENOUS
  Filled 2014-11-17: qty 1

## 2014-11-17 MED ORDER — HYDROCODONE-ACETAMINOPHEN 5-325 MG PO TABS: 1.0000 | ORAL_TABLET | ORAL | Status: DC | PRN

## 2014-11-17 MED ORDER — FENTANYL CITRATE (PF) 100 MCG/2ML IJ SOLN
100.0000 ug | Freq: Once | INTRAMUSCULAR | Status: AC
  Administered 2014-11-17: 100 ug via INTRAVENOUS
  Filled 2014-11-17: qty 2

## 2014-11-17 MED ORDER — IOHEXOL 300 MG/ML  SOLN
100.0000 mL | Freq: Once | INTRAMUSCULAR | Status: AC | PRN
  Administered 2014-11-17: 100 mL via INTRAVENOUS

## 2014-11-17 MED ORDER — IBUPROFEN 800 MG PO TABS: 800.0000 mg | ORAL_TABLET | Freq: Once | ORAL | Status: DC

## 2014-11-17 NOTE — ED Notes (Signed)
Pt to CT

## 2014-11-17 NOTE — ED Notes (Signed)
Pt drinking PO contrast for CT 

## 2014-11-17 NOTE — ED Provider Notes (Signed)
Care assumed from [facility], PA-C at shift change. Pt with back pain, also complaining of right mid abdominal pain, CT abdomen pending, if negative, d/c home.  5:00 PM CT without any acute finding. There is a masslike mixed attenuation along the posterior aspect of the uterine body. Will obtain pelvic ultrasound for further evaluation. Patient updated and agrees. On exam, resting comfortably. Abdomen is soft with tenderness across her lower abdomen. No specific focal tenderness. No peritoneal signs. Labs noted to have mild elevation of LFTs. No upper abdominal tenderness.  7:19 PM Ultrasound showing probable intramural fibroid posterior uterine body. Discussed with patient. Does not have a gynecologist at this time. Will give resources. Pain controlled after Toradol. Stable for discharge. Follow-up with PCP and/or GYN. Return precautions given. Patient states understanding of treatment care plan and is agreeable.  US Transvaginal Non-ob  11/17/2014   CLINICAL DATA:  Patient with abdominal and back pain.  EXAM: TRANSABDOMINAL AND TRANSVAGINAL ULTRASOUND OF PELVIS  TECHNIQUE: Both transabdominal and transvaginal ultrasound examinations of the pelvis were performed. Transabdominal technique was performed for global imaging of the pelvis including uterus, ovaries, adnexal regions, and pelvic cul-de-sac. It was necessary to proceed with endovaginal exam following the transabdominal exam to visualize the endometrium and adnexal structures.  COMPARISON:  CT 11/17/2014  FINDINGS: Uterus  Measurements: 7.1 x 4.8 x 5.8 cm. Within the posterior uterine fundus there is a 1.7 x 1.8 x 1.6 cm mixed echogenicity mass with internal shadowing suggestive of a fibroid, intramural in location.  Endometrium  Thickness: 10 mm.  No focal abnormality visualized.  Right ovary  Measurements: 2.8 x 2.0 x 2.7 cm. There is a 1.3 x 0.9 x 1.0 cm right paraovarian cyst. Otherwise the right ovary is unremarkable in appearance.  Left  ovary  Measurements: 3.1 x 1.6 x 2.4 cm. Normal appearance/no adnexal mass.  Other findings  No free fluid.  IMPRESSION: Probable intramural fibroid posterior uterine body. No acute process within the pelvis.   Electronically Signed   By: Lovey Newcomer M.D.   On: 11/17/2014 18:59   US Pelvis Complete  11/17/2014   CLINICAL DATA:  Patient with abdominal and back pain.  EXAM: TRANSABDOMINAL AND TRANSVAGINAL ULTRASOUND OF PELVIS  TECHNIQUE: Both transabdominal and transvaginal ultrasound examinations of the pelvis were performed. Transabdominal technique was performed for global imaging of the pelvis including uterus, ovaries, adnexal regions, and pelvic cul-de-sac. It was necessary to proceed with endovaginal exam following the transabdominal exam to visualize the endometrium and adnexal structures.  COMPARISON:  CT 11/17/2014  FINDINGS: Uterus  Measurements: 7.1 x 4.8 x 5.8 cm. Within the posterior uterine fundus there is a 1.7 x 1.8 x 1.6 cm mixed echogenicity mass with internal shadowing suggestive of a fibroid, intramural in location.  Endometrium  Thickness: 10 mm.  No focal abnormality visualized.  Right ovary  Measurements: 2.8 x 2.0 x 2.7 cm. There is a 1.3 x 0.9 x 1.0 cm right paraovarian cyst. Otherwise the right ovary is unremarkable in appearance.  Left ovary  Measurements: 3.1 x 1.6 x 2.4 cm. Normal appearance/no adnexal mass.  Other findings  No free fluid.  IMPRESSION: Probable intramural fibroid posterior uterine body. No acute process within the pelvis.   Electronically Signed   By: Lovey Newcomer M.D.   On: 11/17/2014 18:59   Ct Abdomen Pelvis W Contrast  11/17/2014   CLINICAL DATA:  Patient with right hip and leg pain for 1 week. Also right lower abdominal pain.  EXAM: CT  ABDOMEN AND PELVIS WITH CONTRAST  TECHNIQUE: Multidetector CT imaging of the abdomen and pelvis was performed using the standard protocol following bolus administration of intravenous contrast.  CONTRAST:  136mL OMNIPAQUE IOHEXOL  300 MG/ML  SOLN  COMPARISON:  CT abdomen pelvis 11/18/2013  FINDINGS: Lower chest: Bilateral breast prosthesis. Normal heart size. Dependent atelectasis within the bilateral lower lobes.  Hepatobiliary: Unchanged 1.7 cm enhancing lesion within hepatic dome (image 9; series 2). Unchanged scattered subcentimeter low-attenuation hepatic lesions. Gallbladder is unremarkable. No intrahepatic or extrahepatic biliary ductal dilatation.  Pancreas: Unremarkable  Spleen: Unremarkable  Adrenals/Urinary Tract: The adrenal glands are normal. Kidneys enhance symmetrically with contrast. No hydronephrosis. Urinary bladder is unremarkable.  Stomach/Bowel: No abnormal bowel wall thickening or evidence for bowel obstruction. No free fluid or free intraperitoneal air. Normal appendix.  Vascular/Lymphatic: Normal caliber abdominal aorta. No retroperitoneal lymphadenopathy.  Other: There is masslike heterogeneous attenuation along the posterior aspect of the uterine body (image 35; series 3). Multiple follicles within the ovaries bilaterally.  Musculoskeletal: Lower lumbar spine degenerative changes. No aggressive or acute appearing osseous lesions.  IMPRESSION: No acute process within the abdomen or pelvis.  Masslike mixed attenuation along the posterior aspect of the uterine body is nonspecific and may represent fibroid or focal adenomyosis. Consider dedicated evaluation with pelvic ultrasound.  Grossly unchanged enhancing lesion within the hepatic dome which is nonspecific however may represent adenoma or focal nodular hyperplasia. Consider followup MRI in 6 months with Eovist to demonstrate continued stability and for definitive characterization.   Electronically Signed   By: Lovey Newcomer M.D.   On: 11/17/2014 16:46     Carman Ching, PA-C 11/17/14 [place], MD 11/17/14 2302

## 2014-11-17 NOTE — ED Notes (Signed)
Initial Contact - pt A+ox4, changed to hospital gown, family at bedside.  Pt reports c/o RLQ abd pain as well as R hip and LE pain.  Ambulatory with steady gait.  Skin PWD.  Speaking full/clear sentences, rr even/un-lab.  Abd s/nt/nd.  NAD.

## 2014-11-17 NOTE — ED Notes (Signed)
Pt is c/o right hip and leg pain that began about a week ago.  Also has c/o right lower abdominal pain (ovarian type) Pain is burning and stabbing.  Pain comes and goes

## 2014-11-17 NOTE — ED Provider Notes (Signed)
CSN: [id number]     Arrival date & time 11/17/14  1134 History   First MD Initiated Contact with Patient 11/17/14 1148     Chief Complaint  Patient presents with  . Hip Pain    right  . Leg Pain    right  . Abdominal Pain    lower right     (Consider location/radiation/quality/duration/timing/severity/associated sxs/prior Treatment) Patient is a 44 y.o. female presenting with hip pain, leg pain, and abdominal pain.  Hip Pain Associated symptoms include abdominal pain.  Leg Pain Abdominal Pain    Patient presents to the emergency department with right flank, right hip, and right thigh pain. Her pain started about 1 week ago when putting on a pair of pants. It is a burning pain that seems to come and go and nothing seems to make it better or worse. For the past week her right thigh has been numb with no tingling. She has been nauseous but has not vomited.  She denies fever, chills, night sweats, weight loss or weight gain, palpitations, or chest pain. For the past week she has had diarrhea. She does endorse DOE with relief of symptoms with rest.   Past Medical History  Diagnosis Date  . Hyperlipemia 1996  . Ovarian cyst 1998, 2001    s/p  cyst removal   . S/P Botox injection 2012    face    Past Surgical History  Procedure Laterality Date  . Ovarian cyst removal    . Tubal ligation  2004  . Breast surgery Bilateral 2000    implants    Family History  Problem Relation Age of Onset  . Hyperlipidemia Mother   . Hyperlipidemia Maternal Grandmother   . Heart disease Maternal Grandfather   . Heart disease Paternal Grandmother   . Cancer Neg Hx    Social History  Substance Use Topics  . Smoking status: Never Smoker   . Smokeless tobacco: Never Used  . Alcohol Use: No   OB History    No data available     Review of Systems  Gastrointestinal: Positive for abdominal pain.   All other systems negative except as documented in the HPI. All pertinent positives and  negatives as reviewed in the HPI.  Allergies  Morphine and related  Home Medications   Prior to Admission medications   Medication Sig Start Date End Date Taking? Authorizing Provider  clindamycin (CLEOCIN) 150 MG capsule Take 1 capsule (150 mg total) by mouth 3 (three) times daily. 07/08/14   John Molpus, MD  HYDROcodone-acetaminophen (NORCO) 5-325 MG per tablet Take 1-2 tablets by mouth every 6 (six) hours as needed (for pain). 07/08/14   John Molpus, MD  ibuprofen (ADVIL,MOTRIN) 600 MG tablet Take 1 tablet (600 mg total) by mouth every 6 (six) hours as needed. 04/21/14   Ripley Fraise, MD  oxyCODONE-acetaminophen (PERCOCET/ROXICET) 5-325 MG per tablet Take 1 tablet by mouth every 4 (four) hours as needed for severe pain. 04/21/14   Ripley Fraise, MD   BP 103/58 mmHg  Pulse 84  Temp(Src) 98.7 F (37.1 C) (Oral)  Resp 20  SpO2 100%  LMP 10/03/2014 Physical Exam  Constitutional: She is oriented to person, place, and time. She appears well-developed and well-nourished. No distress.  HENT:  Head: Normocephalic and atraumatic.  Mouth/Throat: Oropharynx is clear and moist.  Eyes: Pupils are equal, round, and reactive to light.  Neck: Normal range of motion. Neck supple.  Cardiovascular: Normal rate, regular rhythm and normal heart sounds.  Exam reveals no gallop and no friction rub.   No murmur heard. Pulmonary/Chest: Effort normal and breath sounds normal. No respiratory distress.  Abdominal: Soft. Bowel sounds are normal. She exhibits no distension. There is tenderness. There is no rebound and no guarding.  Tenderness to light and deep palpation in RUQ and RLQ.   When pressing hard on her gall bladder she complained of significant pain in her right flank area.   Musculoskeletal: She exhibits no edema.  Neurological: She is alert and oriented to person, place, and time. She displays normal reflexes. She exhibits normal muscle tone. Coordination normal.  DTR's 2+ bilateral patellar  and achilles   Skin: Skin is warm and dry. No rash noted. No erythema.  Psychiatric: She has a normal mood and affect.  Nursing note and vitals reviewed.   ED Course  Procedures (including critical care time) Labs Review Labs Reviewed  CBC WITH DIFFERENTIAL/PLATELET - Abnormal; Notable for the following:    Hemoglobin 9.3 (*)    HCT 30.9 (*)    MCV 71.7 (*)    MCH 21.6 (*)    RDW 18.2 (*)    Eosinophils Relative 17 (*)    Eosinophils Absolute 1.7 (*)    All other components within normal limits  COMPREHENSIVE METABOLIC PANEL - Abnormal; Notable for the following:    AST 51 (*)    ALT 66 (*)    Anion gap 3 (*)    All other components within normal limits  URINALYSIS, ROUTINE W REFLEX MICROSCOPIC (NOT AT [facility])  LIPASE, BLOOD  PREGNANCY, URINE    Imaging Review US Transvaginal Non-ob  11/17/2014   CLINICAL DATA:  Patient with abdominal and back pain.  EXAM: TRANSABDOMINAL AND TRANSVAGINAL ULTRASOUND OF PELVIS  TECHNIQUE: Both transabdominal and transvaginal ultrasound examinations of the pelvis were performed. Transabdominal technique was performed for global imaging of the pelvis including uterus, ovaries, adnexal regions, and pelvic cul-de-sac. It was necessary to proceed with endovaginal exam following the transabdominal exam to visualize the endometrium and adnexal structures.  COMPARISON:  CT 11/17/2014  FINDINGS: Uterus  Measurements: 7.1 x 4.8 x 5.8 cm. Within the posterior uterine fundus there is a 1.7 x 1.8 x 1.6 cm mixed echogenicity mass with internal shadowing suggestive of a fibroid, intramural in location.  Endometrium  Thickness: 10 mm.  No focal abnormality visualized.  Right ovary  Measurements: 2.8 x 2.0 x 2.7 cm. There is a 1.3 x 0.9 x 1.0 cm right paraovarian cyst. Otherwise the right ovary is unremarkable in appearance.  Left ovary  Measurements: 3.1 x 1.6 x 2.4 cm. Normal appearance/no adnexal mass.  Other findings  No free fluid.  IMPRESSION: Probable intramural  fibroid posterior uterine body. No acute process within the pelvis.   Electronically Signed   By: Lovey Newcomer M.D.   On: 11/17/2014 18:59   US Pelvis Complete  11/17/2014   CLINICAL DATA:  Patient with abdominal and back pain.  EXAM: TRANSABDOMINAL AND TRANSVAGINAL ULTRASOUND OF PELVIS  TECHNIQUE: Both transabdominal and transvaginal ultrasound examinations of the pelvis were performed. Transabdominal technique was performed for global imaging of the pelvis including uterus, ovaries, adnexal regions, and pelvic cul-de-sac. It was necessary to proceed with endovaginal exam following the transabdominal exam to visualize the endometrium and adnexal structures.  COMPARISON:  CT 11/17/2014  FINDINGS: Uterus  Measurements: 7.1 x 4.8 x 5.8 cm. Within the posterior uterine fundus there is a 1.7 x 1.8 x 1.6 cm mixed echogenicity mass with internal shadowing suggestive  of a fibroid, intramural in location.  Endometrium  Thickness: 10 mm.  No focal abnormality visualized.  Right ovary  Measurements: 2.8 x 2.0 x 2.7 cm. There is a 1.3 x 0.9 x 1.0 cm right paraovarian cyst. Otherwise the right ovary is unremarkable in appearance.  Left ovary  Measurements: 3.1 x 1.6 x 2.4 cm. Normal appearance/no adnexal mass.  Other findings  No free fluid.  IMPRESSION: Probable intramural fibroid posterior uterine body. No acute process within the pelvis.   Electronically Signed   By: Lovey Newcomer M.D.   On: 11/17/2014 18:59   Ct Abdomen Pelvis W Contrast  11/17/2014   CLINICAL DATA:  Patient with right hip and leg pain for 1 week. Also right lower abdominal pain.  EXAM: CT ABDOMEN AND PELVIS WITH CONTRAST  TECHNIQUE: Multidetector CT imaging of the abdomen and pelvis was performed using the standard protocol following bolus administration of intravenous contrast.  CONTRAST:  126mL OMNIPAQUE IOHEXOL 300 MG/ML  SOLN  COMPARISON:  CT abdomen pelvis 11/18/2013  FINDINGS: Lower chest: Bilateral breast prosthesis. Normal heart size. Dependent  atelectasis within the bilateral lower lobes.  Hepatobiliary: Unchanged 1.7 cm enhancing lesion within hepatic dome (image 9; series 2). Unchanged scattered subcentimeter low-attenuation hepatic lesions. Gallbladder is unremarkable. No intrahepatic or extrahepatic biliary ductal dilatation.  Pancreas: Unremarkable  Spleen: Unremarkable  Adrenals/Urinary Tract: The adrenal glands are normal. Kidneys enhance symmetrically with contrast. No hydronephrosis. Urinary bladder is unremarkable.  Stomach/Bowel: No abnormal bowel wall thickening or evidence for bowel obstruction. No free fluid or free intraperitoneal air. Normal appendix.  Vascular/Lymphatic: Normal caliber abdominal aorta. No retroperitoneal lymphadenopathy.  Other: There is masslike heterogeneous attenuation along the posterior aspect of the uterine body (image 35; series 3). Multiple follicles within the ovaries bilaterally.  Musculoskeletal: Lower lumbar spine degenerative changes. No aggressive or acute appearing osseous lesions.  IMPRESSION: No acute process within the abdomen or pelvis.  Masslike mixed attenuation along the posterior aspect of the uterine body is nonspecific and may represent fibroid or focal adenomyosis. Consider dedicated evaluation with pelvic ultrasound.  Grossly unchanged enhancing lesion within the hepatic dome which is nonspecific however may represent adenoma or focal nodular hyperplasia. Consider followup MRI in 6 months with E[facility] to demonstrate continued stability and for definitive characterization.   Electronically Signed   By: Lovey Newcomer M.D.   On: 11/17/2014 16:46   I have personally reviewed and evaluated these images and lab results as part of my medical decision-making.    Patient be evaluated for abdominal pain with CT scan and ultrasound.  Patient is given a plan and all questions were answered.   Dalia Heading, PA-C 11/19/14 1531  Lacretia Leigh, MD 11/19/14 [phone]

## 2014-11-17 NOTE — ED Notes (Signed)
Pt to US.

## 2014-11-17 NOTE — Discharge Instructions (Signed)
Take Vicodin for severe pain only. No driving or operating heavy machinery while taking vicodin. This medication may cause drowsiness.   Uterine Fibroid A uterine fibroid is a growth (tumor) that occurs in your uterus. This type of tumor is not cancerous and does not spread out of the uterus. You can have one or many fibroids. Fibroids can vary in size, weight, and where they grow in the uterus. Some can become quite large. Most fibroids do not require medical treatment, but some can cause pain or heavy bleeding during and between periods. CAUSES  A fibroid is the result of a single uterine cell that keeps growing (unregulated), which is different than most cells in the human body. Most cells have a control mechanism that keeps them from reproducing without control.  SIGNS AND SYMPTOMS   Bleeding.  Pelvic pain and pressure.  Bladder problems due to the size of the fibroid.  Infertility and miscarriages depending on the size and location of the fibroid. DIAGNOSIS  Uterine fibroids are diagnosed through a physical exam. Your health care provider may feel the lumpy tumors during a pelvic exam. Ultrasonography may be done to get information regarding size, location, and number of tumors.  TREATMENT   Your health care provider may recommend watchful waiting. This involves getting the fibroid checked by your health care provider to see if it grows or shrinks.   Hormone treatment or an intrauterine device (IUD) may be prescribed.   Surgery may be needed to remove the fibroids (myomectomy) or the uterus (hysterectomy). This depends on your situation. When fibroids interfere with fertility and a woman wants to become pregnant, a health care provider may recommend having the fibroids removed.  [place] care depends on how you were treated. In general:   Keep all follow-up appointments with your health care provider.   Only take over-the-counter or prescription medicines as  directed by your health care provider. If you were prescribed a hormone treatment, take the hormone medicines exactly as directed. Do not take aspirin. It can cause bleeding.   Talk to your health care provider about taking iron pills.  If your periods are troublesome but not so heavy, lie down with your feet raised slightly above your heart. Place cold packs on your lower abdomen.   If your periods are heavy, write down the number of pads or tampons you use per month. Bring this information to your health care provider.   Include green vegetables in your diet.  SEEK IMMEDIATE MEDICAL CARE IF:  You have pelvic pain or cramps not controlled with medicines.   You have a sudden increase in pelvic pain.   You have an increase in bleeding between and during periods.   You have excessive periods and soak tampons or pads in a half hour or less.  You feel lightheaded or have fainting episodes. Document Released: 03/10/2000 Document Revised: 01/01/2013 Document Reviewed: 10/10/2012 [facility] Patient Information 2015 [place], [place]. This information is not intended to replace advice given to you by your health care provider. Make sure you discuss any questions you have with your health care provider.

## 2014-11-25 ENCOUNTER — Ambulatory Visit: Payer: Medicaid Other | Attending: Family Medicine | Admitting: Family Medicine

## 2014-11-25 ENCOUNTER — Other Ambulatory Visit (HOSPITAL_COMMUNITY)
Admission: RE | Admit: 2014-11-25 | Discharge: 2014-11-25 | Disposition: A | Discharge status: Home / Self Care | Payer: Medicaid Other | Source: Ambulatory Visit | Attending: Family Medicine | Admitting: Family Medicine

## 2014-11-25 ENCOUNTER — Encounter: Payer: Self-pay | Admitting: Family Medicine

## 2014-11-25 VITALS — BP 100/63 | HR 67 | Temp 97.9°F | Resp 16 | Ht 67.0 in | Wt 162.0 lb

## 2014-11-25 DIAGNOSIS — Z1151 Encounter for screening for human papillomavirus (HPV): Secondary | ICD-10-CM | POA: Insufficient documentation

## 2014-11-25 DIAGNOSIS — G5711 Meralgia paresthetica, right lower limb: Secondary | ICD-10-CM

## 2014-11-25 DIAGNOSIS — N760 Acute vaginitis: Secondary | ICD-10-CM | POA: Diagnosis present

## 2014-11-25 DIAGNOSIS — D259 Leiomyoma of uterus, unspecified: Secondary | ICD-10-CM | POA: Diagnosis not present

## 2014-11-25 DIAGNOSIS — Z124 Encounter for screening for malignant neoplasm of cervix: Secondary | ICD-10-CM | POA: Diagnosis not present

## 2014-11-25 DIAGNOSIS — Z114 Encounter for screening for human immunodeficiency virus [HIV]: Secondary | ICD-10-CM

## 2014-11-25 DIAGNOSIS — Z01419 Encounter for gynecological examination (general) (routine) without abnormal findings: Secondary | ICD-10-CM | POA: Diagnosis present

## 2014-11-25 DIAGNOSIS — B9689 Other specified bacterial agents as the cause of diseases classified elsewhere: Secondary | ICD-10-CM

## 2014-11-25 DIAGNOSIS — K769 Liver disease, unspecified: Secondary | ICD-10-CM

## 2014-11-25 DIAGNOSIS — A499 Bacterial infection, unspecified: Secondary | ICD-10-CM

## 2014-11-25 DIAGNOSIS — K219 Gastro-esophageal reflux disease without esophagitis: Secondary | ICD-10-CM

## 2014-11-25 DIAGNOSIS — D649 Anemia, unspecified: Secondary | ICD-10-CM

## 2014-11-25 DIAGNOSIS — Z113 Encounter for screening for infections with a predominantly sexual mode of transmission: Secondary | ICD-10-CM | POA: Diagnosis present

## 2014-11-25 DIAGNOSIS — G5710 Meralgia paresthetica, unspecified lower limb: Secondary | ICD-10-CM | POA: Insufficient documentation

## 2014-11-25 DIAGNOSIS — K7689 Other specified diseases of liver: Secondary | ICD-10-CM

## 2014-11-25 LAB — CBC
HCT: 32.3 % — ABNORMAL LOW (ref 36.0–46.0)
HEMOGLOBIN: 10.2 g/dL — AB (ref 12.0–15.0)
MCH: 21.7 pg — ABNORMAL LOW (ref 26.0–34.0)
MCHC: 31.6 g/dL (ref 30.0–36.0)
MCV: 68.6 fL — ABNORMAL LOW (ref 78.0–100.0)
MPV: 10.7 fL (ref 8.6–12.4)
PLATELETS: 389 10*3/uL (ref 150–400)
RBC: 4.71 MIL/uL (ref 3.87–5.11)
RDW: 18.2 % — ABNORMAL HIGH (ref 11.5–15.5)
WBC: 9.5 10*3/uL (ref 4.0–10.5)

## 2014-11-25 LAB — IRON AND TIBC
%SAT: 5 % — AB (ref 11–50)
IRON: 27 ug/dL — AB (ref 40–190)
TIBC: 503 ug/dL — ABNORMAL HIGH (ref 250–450)
UIBC: 476 ug/dL — ABNORMAL HIGH (ref 125–400)

## 2014-11-25 LAB — FERRITIN: FERRITIN: 10 ng/mL (ref 10–291)

## 2014-11-25 MED ORDER — GABAPENTIN 100 MG PO CAPS: 100.0000 mg | ORAL_CAPSULE | Freq: Three times a day (TID) | ORAL | Status: DC | PRN

## 2014-11-25 MED ORDER — OMEPRAZOLE 20 MG PO CPDR: 20.0000 mg | DELAYED_RELEASE_CAPSULE | Freq: Every day | ORAL | Status: DC

## 2014-11-25 NOTE — Progress Notes (Signed)
Subjective:    Patient ID: Alexandria Herrera, female    DOB: July 18, 1970, 44 y.o.   MRN: [id number] CC: uterine fibroids  HPI 44 yo F f/u  1. RLQ and R thigh pain: pain with burning sensation x 2 weeks. No injury. No back pain. Pain is worse with prolonged standing. No rash on skin lesion. Went to ED on 11/17/2014. Had CT abdomen and pelvic/TVUS.  2. Liver lesion: stable from 11/18/13 to 11/17/2014. 17 mm. ademona vs focal hyperplasia. Plan for f/u scan in 6 months. Patient is worried about this and would like to see a specialist. No nausea, emesis. RUQ pain. Reports bitter/sour taste in mouth in AM x 2 months.  3. Anemia: reportedly recently, dx in [place]. Not taking iron. Dizzy and sometimes lightheaded upon rising from sitting or when bending and standing upright.    Social History  Substance Use Topics  . Smoking status: Never Smoker   . Smokeless tobacco: Never Used  . Alcohol Use: No   Review of Systems  Constitutional: Negative for fever and chills.  Eyes: Negative for visual disturbance.  Respiratory: Negative for shortness of breath.   Cardiovascular: Negative for chest pain.  Gastrointestinal: Positive for abdominal pain. Negative for blood in stool.  Genitourinary: Negative for vaginal bleeding, vaginal discharge, vaginal pain and pelvic pain.  Musculoskeletal: Positive for myalgias. Negative for back pain and arthralgias.  Skin: Negative for rash.  Allergic/Immunologic: Negative for immunocompromised state.  Neurological: Positive for dizziness, light-headedness and numbness.  Hematological: Negative for adenopathy. Does not bruise/bleed easily.  Psychiatric/Behavioral: Negative for suicidal ideas and dysphoric mood.      Objective:   Physical Exam  Constitutional: She is oriented to person, place, and time. She appears well-developed and well-nourished. No distress.  HENT:  Head: Normocephalic and atraumatic.  Pulmonary/Chest: Effort normal.  Genitourinary: Vagina  normal and uterus normal. Pelvic exam was performed with patient prone. There is no rash, tenderness or lesion on the right labia. There is no rash, tenderness or lesion on the left labia. Cervix exhibits no motion tenderness, no discharge and no friability.  Musculoskeletal: She exhibits no edema or tenderness.       Legs: Lymphadenopathy:       Right: No inguinal adenopathy present.       Left: No inguinal adenopathy present.  Neurological: She is alert and oriented to person, place, and time.  Skin: Skin is warm and dry. No rash noted.  Psychiatric: She has a normal mood and affect.  BP 100/63 mmHg  Pulse 67  Temp(Src) 97.9 F (36.6 C) (Oral)  Resp 16  Ht 5\' 7"  (1.702 m)  Wt 162 lb (73.483 kg)  BMI 25.37 kg/m2  SpO2 95%  LMP 11/19/2014        Assessment & Plan:  Cherysh was seen today for hospitalization follow-up.  Diagnoses and all orders for this visit:  Lateral femoral cutaneous neuropathy, right -     gabapentin (NEURONTIN) 100 MG capsule; Take 1 capsule (100 mg total) by mouth 3 (three) times daily as needed.  Uterine leiomyoma, unspecified location -     Ambulatory referral to Gynecology  Pap smear for cervical cancer screening -     Cytology - PAP [place]  Anemia, unspecified anemia type -     CBC -     Ferritin -     Iron and TIBC  Screening for HIV (human immunodeficiency virus) -     HIV antibody (with reflex)  Liver lesion, right  lobe -     Ambulatory referral to Gastroenterology  Gastroesophageal reflux disease, esophagitis presence not specified -     omeprazole (PRILOSEC) 20 MG capsule; Take 1 capsule (20 mg total) by mouth daily.

## 2014-11-25 NOTE — Patient Instructions (Addendum)
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  Gabapentin today. Give it more time, if pain persist past 6 weeks plan for sports medicine referral for possible nerve block.  [name] was seen today for hospitalization follow-up.  Diagnoses and all orders for this visit:  Lateral femoral cutaneous neuropathy, right -     gabapentin (NEURONTIN) 100 MG capsule; Take 1 capsule (100 mg total) by mouth 3 (three) times daily as needed.  Uterine leiomyoma, unspecified location -     Ambulatory referral to Gynecology  Pap smear for cervical cancer screening -     Cytology - PAP [place]  Anemia, unspecified anemia type -     CBC -     Ferritin -     Iron and TIBC  Screening for HIV (human immunodeficiency virus) -     HIV antibody (with reflex)  Liver lesion, right lobe -     Ambulatory referral to Gastroenterology  Gastroesophageal reflux disease, esophagitis presence not specified -     omeprazole (PRILOSEC) 20 MG capsule; Take 1 capsule (20 mg total) by mouth daily.  you appear  F/u in September for flu shot F/u with me in 4 weeks for R thigh pain with numbness  Dr. Adrian Blackwater   Gastroesophageal Reflux Disease, Adult Gastroesophageal reflux disease (GERD) happens when acid from your stomach goes into your food pipe (esophagus). The acid can cause a burning feeling in your chest. Over time, the acid can make small holes (ulcers) in your food pipe.  HOME CARE  Ask your doctor for advice about:  Losing weight.  Quitting smoking.  Alcohol use.  Avoid foods and drinks that make your problems worse. You may want to avoid:  Caffeine and alcohol.  Chocolate.  Mints.  Garlic and onions.  Spicy foods.  Citrus fruits, such as oranges, lemons, or limes.  Foods that contain tomato, such as sauce, chili, salsa, and pizza.  Fried and fatty foods.  Avoid lying down for 3 hours before you go to  bed or before you take a nap.  Eat small meals often, instead of large meals.  Wear loose-fitting clothing. Do not wear anything tight around your waist.  Raise (elevate) the head of your bed 6 to 8 inches with wood blocks. Using extra pillows does not help.  Only take medicines as told by your doctor.  Do not take aspirin or ibuprofen. GET HELP RIGHT AWAY IF:   You have pain in your arms, neck, jaw, teeth, or back.  Your pain gets worse or changes.  You feel sick to your stomach (nauseous), throw up (vomit), or sweat (diaphoresis).  You feel short of breath, or you pass out (faint).  Your throw up is green, yellow, black, or looks like coffee grounds or blood.  Your poop (stool) is red, bloody, or black. MAKE SURE YOU:   Understand these instructions.  Will watch your condition.  Will get help right away if you are not doing well or get worse. Document Released: 08/30/2007 Document Revised: 06/05/2011 Document Reviewed: 09/30/2010 [facility] Patient Information 2015 [place], [place]. This information is not intended to replace advice given to you by your health care provider. Make sure you discuss any questions you have with your health care provider.  Lateral Femoral Cutaneous Nerve Block Patient Information  Description: The lateral femoral cutaneous nerve of the thigh is a purely sensory nerve that can become entrapped or irritated for a  number of reasons.  The pain associated with this condition is called meralgia paraesthetica.  Patients affected with this syndrome have burning pain or abnormal sensation along the lateral aspect of the thigh.  The pain can be worsened by prolonged walking, standing, or constrictive garments around the house.   The diagnosis can be confirmed and treatment initiated by blocking the nerve with local anesthetic (like Novocaine).  At times, a steroid solution may be injected at the same time.  The site of injection is through a tiny needle in the  left, lower quadrant of the abdomen.   The entire block usually lasts less than 5 minutes.  Conditions which may be treated by lateral femoral cutaneous nerve block:   Meralagia paraesthetica  Preparation for the injection:   Do not eat any solid food or dairy products within 6 hours of your appointment.   You may drink clear liquids up to 2 hours before appointment.  Clear liquids include water, black coffee, juice or soda. No milk or cream please.  You may take your regular medication, including pain medications, with a sip of water before your appointment.  Diabetics should hold regular insulin (if taken separately) and take 1/2 normal NPH dose the morning of the procedure.  Carry some sugar containing items with you to your appointment.  A driver must accompany you and be prepared to drive you home after your procedure.  Bring all you current medications with you  An IV may be inserted and sedation may be given at the discretion of the physician.  A blood pressure cuff, EKG and other monitors will often be applied during the procedure.  Some patients may need to have extra oxygen administered for a short period.  You will be asked to provide medical information, including your allergies and medications, prior to the procedure.  We must know immediately if you are taking blood thinners (like Coumadin/Warfarin) or if you allergic to IV iodine contrast (dye)  We must know if you could possible be pregnant.   Possible side-effects:   Bleeding from needle site  Infection (rate, may require surgery)  Nerve injury (rare)  Numbness and Tingling (temporary)  Light-headedness (temporary)  Pain at injection site (several day)  Decreased blood pressure (rare, temporary)  Weakness in leg (temporary)  Call if you experience:  Hives or difficulty breathing (go to the emergency room)  Inflammation or drainage at the injection site(s)  Please note:  Although the local  anesthetic injected can often make your leg feel good for several hours after the injection,  The pain may return.  It takes 3-7 days for steroids to work.  You may not notice any pain relief for at least one week.  If effective, we will often do a series of injections spaced 3-6 weeks apart to maximally decrease your pain.  If you have any questions, please cll [phone]

## 2014-11-25 NOTE — Progress Notes (Signed)
HFU abdominal pain  Stated abdominal feeling numb, leg pain and numbness  Hx fbroids GYN referral  Pain Scale #7

## 2014-11-25 NOTE — Assessment & Plan Note (Signed)
A: suspect cause of lateral thigh numbness and sharp pain and discussed with patient that uterine fibroid would not usually cause these symptoms P:  Gabapentin ordered

## 2014-11-25 NOTE — Assessment & Plan Note (Signed)
A: stable liver lesion from one year ago. Reassured patient that this stable lesion is most likely benign. However she insist that she is worried and would like to see a specialist P:  GI referral for additional reassurance

## 2014-11-26 DIAGNOSIS — B9689 Other specified bacterial agents as the cause of diseases classified elsewhere: Secondary | ICD-10-CM | POA: Insufficient documentation

## 2014-11-26 DIAGNOSIS — N760 Acute vaginitis: Secondary | ICD-10-CM

## 2014-11-26 LAB — HIV ANTIBODY (ROUTINE TESTING W REFLEX): HIV: NONREACTIVE

## 2014-11-26 LAB — CERVICOVAGINAL ANCILLARY ONLY
Chlamydia: NEGATIVE
Neisseria Gonorrhea: NEGATIVE
Wet Prep (BD Affirm): POSITIVE — AB

## 2014-11-26 MED ORDER — FLUCONAZOLE 150 MG PO TABS: 150.0000 mg | ORAL_TABLET | Freq: Once | ORAL | Status: DC

## 2014-11-26 MED ORDER — METRONIDAZOLE 500 MG PO TABS: 500.0000 mg | ORAL_TABLET | Freq: Two times a day (BID) | ORAL | Status: DC

## 2014-11-26 MED ORDER — FERROUS SULFATE 325 (65 FE) MG PO TABS: 325.0000 mg | ORAL_TABLET | Freq: Two times a day (BID) | ORAL | Status: DC

## 2014-11-26 NOTE — Addendum Note (Signed)
Addended by: Boykin Nearing on: 11/26/2014 10:04 AM   Modules accepted: Orders

## 2014-11-26 NOTE — Addendum Note (Signed)
Addended by: Boykin Nearing on: 11/26/2014 11:12 AM   Modules accepted: Orders

## 2014-11-27 LAB — CYTOLOGY - PAP

## 2014-12-03 ENCOUNTER — Telehealth: Payer: Self-pay | Admitting: *Deleted

## 2014-12-03 NOTE — Telephone Encounter (Signed)
-----   Message from Boykin Nearing, MD sent at 11/26/2014  4:45 PM EDT ----- Negative Gc/chlam. Please inform patient

## 2014-12-03 NOTE — Telephone Encounter (Signed)
Date of birth verified by pt  Negative Gc/chlam, HIV and pap results given  Positive BV on wep preb. Results  Notified rx at [facility]  Pt verbalized understanding

## 2014-12-03 NOTE — Telephone Encounter (Signed)
-----   Message from Boykin Nearing, MD sent at 11/26/2014 10:03 AM EDT ----- Screening HIV negative Hgb 10 with iron deficiency, oral iron to be taken with vit C or small amount of orange juice

## 2014-12-03 NOTE — Telephone Encounter (Signed)
-----   Message from Boykin Nearing, MD sent at 11/26/2014 11:10 AM EDT ----- Positive BV on wet prep, will treat with flagyl followed by diflucan to prevent yeast

## 2014-12-03 NOTE — Telephone Encounter (Signed)
-----   Message from Boykin Nearing, MD sent at 11/27/2014  4:12 PM EDT ----- Negative pap  Repeat in 3 years

## 2014-12-14 ENCOUNTER — Encounter: Payer: Self-pay | Admitting: Obstetrics & Gynecology

## 2014-12-18 ENCOUNTER — Encounter: Payer: Self-pay | Admitting: Obstetrics & Gynecology

## 2014-12-18 ENCOUNTER — Ambulatory Visit (INDEPENDENT_AMBULATORY_CARE_PROVIDER_SITE_OTHER): Payer: Medicaid Other | Admitting: Obstetrics & Gynecology

## 2014-12-18 VITALS — BP 115/76 | HR 74 | Ht 67.0 in | Wt 164.3 lb

## 2014-12-18 DIAGNOSIS — N939 Abnormal uterine and vaginal bleeding, unspecified: Secondary | ICD-10-CM | POA: Diagnosis present

## 2014-12-18 DIAGNOSIS — G5711 Meralgia paresthetica, right lower limb: Secondary | ICD-10-CM

## 2014-12-18 LAB — POCT PREGNANCY, URINE: Preg Test, Ur: NEGATIVE

## 2014-12-18 MED ORDER — MEGESTROL ACETATE 40 MG PO TABS: 40.0000 mg | ORAL_TABLET | Freq: Every day | ORAL | Status: DC

## 2014-12-18 NOTE — Patient Instructions (Signed)
Sangrado uterino anormal (Abnormal Uterine Bleeding) El sangrado uterino anormal puede afectar a las mujeres que estn en diversas etapas de la vida, desde adolescentes, mujeres frtiles y Games developer, hasta mujeres que han llegado a la menopausia. Hay diversas clases de sangrado uterino que se consideran anormales, entre ellas:  Prdidas de sangre o [employer] perodos.  Hemorragias luego de Retail banker.  Sangrado abundante o ms que lo habitual.  Perodos que duran ms que lo normal.  Sangrado luego de la menopausia. Muchos casos de sangrado uterino anormal son leves y simples de tratar, mientras que otros son ms graves. El mdico debe evaluar cualquier clase de sangrado anormal. El tratamiento depender de la causa del sangrado. INSTRUCCIONES PARA EL CUIDADO EN EL HOGAR Controle su afeccin para ver si hay cambios. Las siguientes indicaciones ayudarn a Chief Strategy Officer que pueda sentir:  Evite las duchas vaginales y el uso de tampones segn las indicaciones del mdico.  [place] compresas con frecuencia. Deber hacerse exmenes plvicos regulares y pruebas de Papanicolaou. Cumpla con todas las visitas de control y [employer] diagnsticos, segn le indique su mdico.  SOLICITE ATENCIN MDICA SI:   El sangrado dura ms de 1 semana.  Se siente mareada por momentos. SOLICITE ATENCIN MDICA DE INMEDIATO SI:   Se desmaya.  Debe cambiarse la compresa cada 15 a 30 minutos.  Siente dolor abdominal.  Jaclynn Guarneri.  Se siente dbil o presenta sudoracin.  Elimina cogulos grandes por la vagina.  Comienza a sentir nuseas y [place]. ASEGRESE DE QUE:   Comprende estas instrucciones.  Controlar su afeccin.  Recibir ayuda de inmediato si no mejora o si empeora. Document Released: 03/13/2005 Document Revised: 03/18/2013 [facility] Patient Information 2015 [place]. This information is not intended to replace advice given  to you by your health care provider. Make sure you discuss any questions you have with your health care provider. Fibroma uterino (Uterine Fibroid) Un fibroma uterino es un crecimiento (tumor) dentro del tero. Este tipo de tumor no es Radio broadcast assistant y no se extiende fuera del tero. Podr tener uno o varios fibromas. Los fibromas pueden variar en tamao, peso y TEFL teacher en que se desarrollan dentro del tero. Algunos pueden llegar a ser bastante grandes. La mayora de los fibromas no necesitan tratamiento mdico, pero algunos pueden causar dolor o sangrado abundante durante los perodos y Kimberly. CAUSAS  Un fibroma es el resultado del desarrollo continuo de una nica clula uterina que sigue creciendo (no regulada) que es diferente al resto de las clulas del cuerpo humano. La mayora de las clulas tiene un mecanismo de control que evita que se reproduzcan de Research officer, trade union.  SIGNOS Y SNTOMAS   Hemorragias.  Dolor y sensacin de presin en la pelvis.  Problemas en la vejiga debido al tamao del fibroma.  Infertilidad y abortos espontneos, segn el tamao y la ubicacin del fibroma. DIAGNSTICO  Los fibromas uterinos se diagnostican con un examen fsico. El mdico puede palpar los tumores abultados al realizar el examen de la pelvis. Una ecografa puede indicarse para tener informacin del tamao, la ubicacin y el nmero de tumores.  TRATAMIENTO   El mdico puede considerar que es conveniente esperar y Barrister's clerk. Esto incluye el control del fibroma por parte del mdico para observar si crece o disminuye su tamao.  Podr indicarle un tratamiento hormonal o el uso de un dispositivo intrauterino (DIU).  En algunos casos es necesaria la ciruga para extirpar el fibroma (miomectoma) o el tero (  histerectoma). Esto depender de su situacin. Cuando una mujer desea quedar embarazada y los fibromas interfieren en su fertilidad, el mdico puede recomendar la extirpacin del  fibroma.  INSTRUCCIONES PARA EL CUIDADO EN EL HOGAR  Los cuidados en el hogar dependen del tratamiento que haya recibido. En general:   Cumpla con todas las visitas de control, segn le indique su mdico.  Tome slo medicamentos de venta libre o recetados, segn las indicaciones del mdico. Si le recetaron un tratamiento hormonal, tome los medicamentos hormonales como le indicaron. No tome aspirina. Puede ocasionar hemorragias.  Consulte al mdico si debe tomar pldoras de hierro.  Si sus perodos son molestos pero no tan abundantes, acustese con los pies ligeramente elevados por encima del nivel del corazn. Coloque compresas fras en la zona inferior del abdomen.  Si sus perodos son muy abundantes, anote el nmero de compresas o tampones que [place] cada mes. Lleve esta informacin a su consulta mdica.  Incluya vegetales verdes en su dieta. SOLICITE ATENCIN MDICA DE INMEDIATO SI:  Siente dolor o clicos en la pelvis y no puede controlarlos con los medicamentos.  El dolor en la pelvis aumenta de manera repentina.  Aumenta el sangrado entre los perodos o [employer].  Si tiene perodos muy abundantes y debe cambiar un tampn o una toalla higinica cada media hora o menos.  Se siente mareado o tiene episodios de [place]. Document Released: 03/13/2005 Document Revised: 01/01/2013 [facility] Patient Information 2015 [place], [place]. This information is not intended to replace advice given to you by your health care provider. Make sure you discuss any questions you have with your health care provider.

## 2014-12-18 NOTE — Progress Notes (Signed)
Patient ID: Djeneba Barsch, female   DOB: [birth year], 44 y.o.   MRN: [id number] History:  44 y.o. [id number] here today for eval of pelvic pain and AUB. Pt reports heavy bleeding 3 cycles prev. Pt reports NO intermenstrual bleeding.  At that time she was passing clots.  That occurred for 1-2 cycles.  The first day is heavy with clots. Cycles last 3-4 days.  Pt also reports numbness in the inguinal region on her right leg with numbness of right thigh.  Pt was Rx'd Neurontin by her primary care physician.   She reports that she took it briefly, less than 2 weeks, and stopped it because her sx did not immediately improve.     She reports that she was on OCPs years prev and did not like the way they made her feel.  She does not know which pill she was on.   The following portions of the patient's history were reviewed and updated as appropriate: allergies, current medications, past family history, past medical history, past social history, past surgical history and problem list.  Review of Systems:  Pertinent items are noted in HPI.  Objective:  Physical Exam Blood pressure 115/76, pulse 74, height 5\' 7"  (1.702 m), weight 164 lb 4.8 oz (74.526 kg), last menstrual period 12/14/2014. Gen: NAD Abd: Soft, nontender and nondistended. Well healed transverse incision. Pelvic: Normal appearing external genitalia; normal appearing vaginal mucosa and cervix.  Normal discharge.  Small uterus, no other palpable masses, no uterine or adnexal tenderness.  No inguinal lymphadenopathy on left or right Numbness on right upper thigh- ant     Labs and Imaging 11/17/2014 CLINICAL DATA: Patient with abdominal and back pain.  EXAM: TRANSABDOMINAL AND TRANSVAGINAL ULTRASOUND OF PELVIS  TECHNIQUE: Both transabdominal and transvaginal ultrasound examinations of the pelvis were performed. Transabdominal technique was performed for global imaging of the pelvis including uterus, ovaries, adnexal regions, and pelvic  cul-de-sac. It was necessary to proceed with endovaginal exam following the transabdominal exam to visualize the endometrium and adnexal structures.  COMPARISON: CT 11/17/2014  FINDINGS: Uterus  Measurements: 7.1 x 4.8 x 5.8 cm. Within the posterior uterine fundus there is a 1.7 x 1.8 x 1.6 cm mixed echogenicity mass with internal shadowing suggestive of a fibroid, intramural in location.  Endometrium  Thickness: 10 mm. No focal abnormality visualized.  Right ovary  Measurements: 2.8 x 2.0 x 2.7 cm. There is a 1.3 x 0.9 x 1.0 cm right paraovarian cyst. Otherwise the right ovary is unremarkable in appearance.  Left ovary  Measurements: 3.1 x 1.6 x 2.4 cm. Normal appearance/no adnexal mass.  Other findings  No free fluid.  IMPRESSION: Probable intramural fibroid posterior uterine body. No acute process within the pelvis.   Assessment & Plan:  Heavy uterine bleeding with NO intermenstrual cycles. Pt with 1 possible very small fibroid that is NOT submucosal.  This is unlikely the cause of her bleeding.  Sh is very concerned about the fibroid and I spent >15 min on discussion of the fibroid alone.  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  Would seek another etiology as the cause of those sx. Pt agrees to Megace 40mg  daily to decrease the amount of bleeding.  Would consider an IUD in the future.   F/u in 3 months to reassess or sooner prn May need endo bx if sx do not respond to Megace  Lateral cutaneous nerve sx: Rec restating Neurontin 100mg  tid and  f/u with her primary care if sx do not improve    Carolyn L. Harraway-Smith, M.D., Cherlynn June

## 2015-01-12 ENCOUNTER — Encounter: Payer: Self-pay | Admitting: Certified Nurse Midwife

## 2015-01-12 ENCOUNTER — Ambulatory Visit (INDEPENDENT_AMBULATORY_CARE_PROVIDER_SITE_OTHER): Payer: Self-pay | Admitting: Certified Nurse Midwife

## 2015-01-12 ENCOUNTER — Telehealth: Payer: Self-pay

## 2015-01-12 VITALS — BP 101/67 | HR 68 | Temp 98.8°F | Wt 164.0 lb

## 2015-01-12 DIAGNOSIS — R200 Anesthesia of skin: Secondary | ICD-10-CM

## 2015-01-12 DIAGNOSIS — D500 Iron deficiency anemia secondary to blood loss (chronic): Secondary | ICD-10-CM | POA: Diagnosis not present

## 2015-01-12 DIAGNOSIS — N939 Abnormal uterine and vaginal bleeding, unspecified: Secondary | ICD-10-CM

## 2015-01-12 MED ORDER — FERIVA 21/7 75-1 MG PO TABS: 1.0000 | ORAL_TABLET | Freq: Every day | ORAL | Status: DC

## 2015-01-12 MED ORDER — TRANEXAMIC ACID 650 MG PO TABS: 1300.0000 mg | ORAL_TABLET | Freq: Three times a day (TID) | ORAL | Status: DC

## 2015-01-12 NOTE — Telephone Encounter (Signed)
Patient was given an appt with Dr. Delsa Sale, MD, soonest available was 02/17/15 @ 2:40pm - patient was given address, phone number, etc

## 2015-01-13 ENCOUNTER — Telehealth: Payer: Self-pay

## 2015-01-13 NOTE — Telephone Encounter (Signed)
Told patient [facility] reviewing referral, will contact patient this week to schedule appt.

## 2015-01-13 NOTE — Progress Notes (Signed)
Patient ID: Alexandria Herrera, female   DOB: Aug 25, 1970, 44 y.o.   MRN: [id number]   Chief Complaint  Patient presents with  . Gynecologic Exam    HPI Alexandria Herrera is a 44 y.o. female.  Was seen at [facility] by Dr. Ihor Dow a few weeks ago.  Desires to have her AUB treated.  States that she is soaking through her clothing while at work with menorrhagia and that it is embarassing.  This has been happening for years.  States her periods last about 7-10 days with large clots, heavy bleeding and severe cramping.  States that she has tried hormones in the past but that they make her sick.  Desires for a female provider.  Is from [place].  States that her mother and MGM had the same problem and had hysterectomies.  Ultrasound reviewed with patient, 2 small fibroids and one small ovarian cyst.   She has a hx of ovarian cyst removal.  She also c/o right groin numbness.  Denies any recent trauma to the area, states that it is constantly numb for the last few months.  Has not seen a provider for this concern.  Has tried Gabapentin for the nerve pain from the ED at [facility].    HPI  Past Medical History  Diagnosis Date  . Hyperlipemia 1996  . Ovarian cyst 1998, 2001    s/p  cyst removal   . S/P Botox injection 2012    face     Past Surgical History  Procedure Laterality Date  . Ovarian cyst removal    . Tubal ligation  2004  . Breast surgery Bilateral 2000    implants     Family History  Problem Relation Age of Onset  . Hyperlipidemia Mother   . Hyperlipidemia Maternal Grandmother   . Heart disease Maternal Grandfather   . Heart disease Paternal Grandmother   . Cancer Neg Hx     Social History Social History  Substance Use Topics  . Smoking status: Never Smoker   . Smokeless tobacco: Never Used  . Alcohol Use: No    Allergies  Allergen Reactions  . Morphine And Related Swelling    Face and lip swelling Face and lip swelling    Current Outpatient Prescriptions   Medication Sig Dispense Refill  . cyclobenzaprine (FLEXERIL) 10 MG tablet Take 1 tablet by mouth at bedtime as needed.    . ferrous sulfate 325 (65 FE) MG tablet Take 1 tablet (325 mg total) by mouth 2 (two) times daily with a meal. 60 tablet 3  . gabapentin (NEURONTIN) 100 MG capsule Take 1 capsule (100 mg total) by mouth 3 (three) times daily as needed. 90 capsule 3  . ibuprofen (ADVIL,MOTRIN) 200 MG tablet Take 200 mg by mouth every 6 (six) hours as needed.    . megestrol (MEGACE) 40 MG tablet Take 1 tablet (40 mg total) by mouth daily. 30 tablet 6  . FeAsp-B12-FA-C-DSS-SuccAc-Zn (FERIVA 21/7) 75-1 MG TABS Take 1 tablet by mouth daily. 28 tablet 4  . HYDROcodone-acetaminophen (NORCO/VICODIN) 5-325 MG per tablet Take 1-2 tablets by mouth every 4 (four) hours as needed. (Patient not taking: Reported on 12/18/2014) 10 tablet 0  . metroNIDAZOLE (FLAGYL) 500 MG tablet Take 1 tablet (500 mg total) by mouth 2 (two) times daily. (Patient not taking: Reported on 12/18/2014) 14 tablet 0  . naproxen sodium (ANAPROX) 220 MG tablet Take 440 mg by mouth 2 (two) times daily as needed (pain).    Marland Kitchen omeprazole (PRILOSEC) 20  MG capsule Take 1 capsule (20 mg total) by mouth daily. (Patient not taking: Reported on 12/18/2014) 30 capsule 3  . tranexamic acid (LYSTEDA) 650 MG TABS tablet Take 2 tablets (1,300 mg total) by mouth 3 (three) times daily. 30 tablet 3  . [DISCONTINUED] promethazine (PHENERGAN) 25 MG suppository Place 1 suppository (25 mg total) rectally every 6 (six) hours as needed for nausea or vomiting. (Patient not taking: Reported on 03/11/2014) 12 each 0   No current facility-administered medications for this visit.    Review of Systems Review of Systems Constitutional: negative for fatigue and weight loss Respiratory: negative for cough and wheezing Cardiovascular: negative for chest pain, fatigue and palpitations Gastrointestinal: negative for abdominal pain and change in bowel  habits Genitourinary: +AUB Integument/breast: negative for nipple discharge Musculoskeletal:negative for myalgias Neurological: negative for gait problems and tremors Behavioral/Psych: negative for abusive relationship, depression Endocrine: negative for temperature intolerance     Blood pressure 101/67, pulse 68, temperature 98.8 F (37.1 C), weight 164 lb (74.39 kg), last menstrual period 12/11/2014.  Physical Exam Physical Exam General:   alert  Skin:   no rash or abnormalities  Lungs:   clear to auscultation bilaterally  Heart:   regular rate and rhythm, S1, S2 normal, no murmur, click, rub or gallop  Breasts:   deferred  Abdomen:  normal findings: no organomegaly, soft, non-tender and no hernia  Pelvis:  deferred    90% of 15 min visit spent on counseling and coordination of care.   Data Reviewed Previous medical hx, labs, meds  Assessment     Anemia AUB Small fibroids Ovarian cyst Right Groin numbness      Plan    Orders Placed This Encounter  Procedures  . Ambulatory referral to Neurology    Referral Priority:  Routine    Referral Type:  Consultation    Referral Reason:  Specialty Services Required    Requested Specialty:  Neurology    Number of Visits Requested:  1   Meds ordered this encounter  Medications  . ibuprofen (ADVIL,MOTRIN) 200 MG tablet    Sig: Take 200 mg by mouth every 6 (six) hours as needed.  . tranexamic acid (LYSTEDA) 650 MG TABS tablet    Sig: Take 2 tablets (1,300 mg total) by mouth 3 (three) times daily.    Dispense:  30 tablet    Refill:  3  . FeAsp-B12-FA-C-DSS-SuccAc-Zn (FERIVA 21/7) 75-1 MG TABS    Sig: Take 1 tablet by mouth daily.    Dispense:  28 tablet    Refill:  4    Need to obtain previous records Possible management options include: Hysterectomy d/t intolerance to hormonal treatment.  Follow up with Dr. Delsa Sale for a surgical consult.

## 2015-02-15 ENCOUNTER — Encounter: Payer: Self-pay | Admitting: Neurology

## 2015-02-15 ENCOUNTER — Ambulatory Visit (INDEPENDENT_AMBULATORY_CARE_PROVIDER_SITE_OTHER): Payer: Medicaid Other | Admitting: Neurology

## 2015-02-15 VITALS — BP 110/70 | HR 74 | Resp 16 | Ht 67.0 in | Wt 164.0 lb

## 2015-02-15 DIAGNOSIS — G5711 Meralgia paresthetica, right lower limb: Secondary | ICD-10-CM

## 2015-02-15 DIAGNOSIS — M545 Low back pain: Secondary | ICD-10-CM | POA: Diagnosis not present

## 2015-02-15 DIAGNOSIS — G5710 Meralgia paresthetica, unspecified lower limb: Secondary | ICD-10-CM | POA: Insufficient documentation

## 2015-02-15 MED ORDER — GABAPENTIN 100 MG PO CAPS: 100.0000 mg | ORAL_CAPSULE | Freq: Two times a day (BID) | ORAL | Status: AC

## 2015-02-15 NOTE — Patient Instructions (Addendum)
1.  MRI lumbar spine without contrast.  We will call you with the results of your testing. 2.  Increase gabapentin 200mg  twice daily 3.  Encouraged weight loss 4.  Avoid tight fitting clothing  5.  Return to clinic as needed

## 2015-02-15 NOTE — Progress Notes (Signed)
[facility] Clinic Note - Initial Visit   Date: 02/15/2015  Alexandria Herrera MRN: [id number] DOB: [birth year]   Dear Dr. Adrian Blackwater:  Thank you for your kind referral of Alexandria Herrera for consultation of right thigh numbness. Although her history is well known to you, please allow [place] to reiterate it for the purpose of our medical record. The patient was accompanied to the clinic by self.    History of Present Illness: Alexandria Herrera is a 44 y.o. right-handed [place] female with no significant medical problem presenting for evaluation of right thigh burning sensation.    She reports falling on ice at work in March 2016 and developed low back pain which resolved within a few months, but the pain is still present when she lays down in bed.  Around July 2016, she began experiencing numbness and burning pain over the right lateral thigh.  It does not radiate past her knee, into her medial thigh, or posterior thigh.  Symptoms are intermittent and triggered by bending, pushing, or lifting something.  She has constant numbness over the right inguinal region.  She takes gabapentin 100-300mg  at bedtime which helps.  She feels that her leg buckles at times and she cannot flex her right hip without pain.  She has gained about 6lb in her lower abdominal region and admits to wearing very tight pants when symptoms started and noticed pain was much more severe.  Out-side paper records, electronic medical record, and images have been reviewed where available and summarized as:  Lab Results  Component Value Date   TSH 2.630 10/09/2013     Past Medical History  Diagnosis Date  . Hyperlipemia 1996  . Ovarian cyst 1998, 2001    s/p  cyst removal   . S/P Botox injection 2012    face     Past Surgical History  Procedure Laterality Date  . Ovarian cyst removal    . Tubal ligation  2004  . Breast surgery Bilateral 2000    implants      Medications:  Outpatient  Encounter Prescriptions as of 02/15/2015  Medication Sig  . cyclobenzaprine (FLEXERIL) 10 MG tablet Take 1 tablet by mouth at bedtime as needed.  . ferrous sulfate 325 (65 FE) MG tablet Take 1 tablet (325 mg total) by mouth 2 (two) times daily with a meal.  . gabapentin (NEURONTIN) 100 MG capsule Take 1 capsule (100 mg total) by mouth 3 (three) times daily as needed.  Marland Kitchen ibuprofen (ADVIL,MOTRIN) 200 MG tablet Take 200 mg by mouth every 6 (six) hours as needed.  . tranexamic acid (LYSTEDA) 650 MG TABS tablet Take 2 tablets (1,300 mg total) by mouth 3 (three) times daily.  . [DISCONTINUED] FeAsp-B12-FA-C-DSS-SuccAc-Zn (FERIVA 21/7) 75-1 MG TABS Take 1 tablet by mouth daily.  . [DISCONTINUED] HYDROcodone-acetaminophen (NORCO/VICODIN) 5-325 MG per tablet Take 1-2 tablets by mouth every 4 (four) hours as needed. (Patient not taking: Reported on 12/18/2014)  . [DISCONTINUED] megestrol (MEGACE) 40 MG tablet Take 1 tablet (40 mg total) by mouth daily.  . [DISCONTINUED] metroNIDAZOLE (FLAGYL) 500 MG tablet Take 1 tablet (500 mg total) by mouth 2 (two) times daily. (Patient not taking: Reported on 12/18/2014)  . [DISCONTINUED] naproxen sodium (ANAPROX) 220 MG tablet Take 440 mg by mouth 2 (two) times daily as needed (pain).  . [DISCONTINUED] omeprazole (PRILOSEC) 20 MG capsule Take 1 capsule (20 mg total) by mouth daily. (Patient not taking: Reported on 12/18/2014)   No facility-administered encounter medications on file as of  02/15/2015.     Allergies:  Allergies  Allergen Reactions  . Morphine And Related Swelling    Face and lip swelling Face and lip swelling    Family History: Family History  Problem Relation Age of Onset  . Hyperlipidemia Mother   . Hyperlipidemia Maternal Grandmother   . Heart disease Maternal Grandfather   . Heart disease Paternal Grandmother   . Cancer Neg Hx     Social History: Social History  Substance Use Topics  . Smoking status: Never Smoker   . Smokeless  tobacco: Never Used  . Alcohol Use: No   Social History   Social History Narrative   Lives at home with husband and two childrew          Review of Systems:  CONSTITUTIONAL: No fevers, chills, night sweats, or weight loss.   EYES: No visual changes or eye pain ENT: No hearing changes.  No history of nose bleeds.   RESPIRATORY: No cough, wheezing and shortness of breath.   CARDIOVASCULAR: Negative for chest pain, and palpitations.   GI: Negative for abdominal discomfort, blood in stools or black stools.  No recent change in bowel habits.   GU:  No history of incontinence.   MUSCLOSKELETAL: No history of joint pain or swelling.  No myalgias.   SKIN: Negative for lesions, rash, and itching.   HEMATOLOGY/ONCOLOGY: Negative for prolonged bleeding, bruising easily, and swollen nodes.  No history of cancer.   ENDOCRINE: Negative for cold or heat intolerance, polydipsia or goiter.   PSYCH:  No depression or anxiety symptoms.   NEURO: As Above.   Vital Signs:  BP 110/70 mmHg  Pulse 74  Resp 16  Ht 5\' 7"  (1.702 m)  Wt 164 lb (74.39 kg)  BMI 25.68 kg/m2  SpO2 98%  LMP 01/30/2015 (Approximate)   General Medical Exam:   General:  Well appearing, comfortable.   Eyes/ENT: see cranial nerve examination.   Neck: No masses appreciated.  Full range of motion without tenderness.  No carotid bruits. Respiratory:  Clear to auscultation, good air entry bilaterally.   Cardiac:  Regular rate and rhythm, no murmur.   Extremities:  No deformities, edema, or skin discoloration.  Skin:  No rashes or lesions.  Neurological Exam: MENTAL STATUS including orientation to time, place, person, recent and remote memory, attention span and concentration, language, and fund of knowledge is normal.  Speech is not dysarthric.  CRANIAL NERVES: II:  No visual field defects.  Unremarkable fundi.   III-IV-VI: Pupils equal round and reactive to light.  Normal conjugate, extra-ocular eye movements in all  directions of gaze.  No nystagmus.  No ptosis. V:  Normal facial sensation. VII:  Facial weakness due to Botox injections especially over the cheeks. Smile is static and symmetric. VIII:  Normal hearing and vestibular function.   IX-X:  Normal palatal movement.   XI:  Normal shoulder shrug and head rotation.   XII:  Normal tongue strength and range of motion, no deviation or fasciculation.  MOTOR:  No atrophy, fasciculations or abnormal movements.  No pronator drift.  Tone is normal.    Right Upper Extremity:    Left Upper Extremity:    Deltoid  5/5   Deltoid  5/5   Biceps  5/5   Biceps  5/5   Triceps  5/5   Triceps  5/5   Wrist extensors  5/5   Wrist extensors  5/5   Wrist flexors  5/5   Wrist flexors  5/5  Finger extensors  5/5   Finger extensors  5/5   Finger flexors  5/5   Finger flexors  5/5   Dorsal interossei  5/5   Dorsal interossei  5/5   Abductor pollicis  5/5   Abductor pollicis  5/5   Tone (Ashworth scale)  0  Tone (Ashworth scale)  0   Right Lower Extremity:    Left Lower Extremity:    Hip flexors  5/5   Hip flexors  5/5   Hip extensors  5/5   Hip extensors  5/5   Knee flexors  5/5   Knee flexors  5/5   Knee extensors  5/5   Knee extensors  5/5   Dorsiflexors  5/5   Dorsiflexors  5/5   Plantarflexors  5/5   Plantarflexors  5/5   Toe extensors  5/5   Toe extensors  5/5   Toe flexors  5/5   Toe flexors  5/5   Tone (Ashworth scale)  0  Tone (Ashworth scale)  0   MSRs:  Right                                                                 Left brachioradialis 2+  brachioradialis 2+  biceps 2+  biceps 2+  triceps 2+  triceps 2+  patellar 2+  patellar 2+  ankle jerk 2+  ankle jerk 2+  Hoffman no  Hoffman no  plantar response down  plantar response down   SENSORY:  Reduced pin prick, light touch, and temperature over the right anterolateral thigh.  Sensation intact to all modialies in the other extremities.  COORDINATION/GAIT: Normal finger-to- nose-finger and  heel-to-shin.  Intact rapid alternating movements bilaterally.  Able to rise from a chair without using arms.  Gait narrow based and stable. Tandem and stressed gait intact.    IMPRESSION: Alexandria Herrera is a 44 year-old female presenting for evaluation of low back pain and right thigh paresthesias.  Her examination is consistent with diminished sensation in all modalities over the anterolateral thigh on the right side. 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 Management is conservative and avoidance of repetitive trauma to this region.  Alternatively with her low back pain L2-3 radiculopathy cannot be excluded, so will order MRI lumbar spine, as patient is requesting this. I have discussed the diagnosis, pathophysiology, management plan, and risks and benefits of medications, and prognosis.    PLAN/RECOMMENDATIONS:  1. Increase neurontin 200mg  twice daily 2. MRI lumbar spine wo contrast 3. PT declined 4. Avoid tight fitting clothing or belts  5. Weight loss encouraged  Return to clinic as needed.   The duration of this appointment visit was 40 minutes of face-to-face time with the patient.  Greater than 50% of this time was spent in counseling, explanation of diagnosis, planning of further management, and coordination of care.   Thank you for allowing me to participate in patient's care.  If I can answer any additional questions, I would be pleased to do so.    Sincerely,    Kailena Lubas K. Posey Pronto, DO

## 2015-02-22 ENCOUNTER — Other Ambulatory Visit: Payer: Self-pay | Admitting: *Deleted

## 2015-02-22 ENCOUNTER — Ambulatory Visit (HOSPITAL_COMMUNITY): Admission: RE | Admit: 2015-02-22 | Payer: Medicaid Other | Source: Ambulatory Visit

## 2015-02-22 ENCOUNTER — Telehealth: Payer: Self-pay | Admitting: Neurology

## 2015-02-22 DIAGNOSIS — G5711 Meralgia paresthetica, right lower limb: Secondary | ICD-10-CM

## 2015-02-22 DIAGNOSIS — M545 Low back pain: Secondary | ICD-10-CM

## 2015-02-22 NOTE — Telephone Encounter (Signed)
Alexandria Herrera patient 

## 2015-02-22 NOTE — Telephone Encounter (Signed)
Patient notified that MRI will not be covered by insurance.  She will need to try PT first.  Patient agreed and referral sent.

## 2015-02-22 NOTE — Telephone Encounter (Signed)
Pt called to ask about her MRI being canceled/ return call @ [phone]

## 2015-02-23 ENCOUNTER — Ambulatory Visit (HOSPITAL_COMMUNITY): Payer: Medicaid Other

## 2015-06-30 ENCOUNTER — Encounter (HOSPITAL_BASED_OUTPATIENT_CLINIC_OR_DEPARTMENT_OTHER): Payer: Self-pay

## 2015-06-30 ENCOUNTER — Emergency Department (HOSPITAL_BASED_OUTPATIENT_CLINIC_OR_DEPARTMENT_OTHER): Payer: Medicaid Other

## 2015-06-30 ENCOUNTER — Emergency Department (HOSPITAL_BASED_OUTPATIENT_CLINIC_OR_DEPARTMENT_OTHER)
Admission: EM | Admit: 2015-06-30 | Discharge: 2015-07-01 | Disposition: A | Discharge status: Home / Self Care | Payer: Medicaid Other | Attending: Emergency Medicine | Admitting: Emergency Medicine

## 2015-06-30 DIAGNOSIS — E785 Hyperlipidemia, unspecified: Secondary | ICD-10-CM | POA: Diagnosis not present

## 2015-06-30 DIAGNOSIS — R002 Palpitations: Secondary | ICD-10-CM | POA: Insufficient documentation

## 2015-06-30 DIAGNOSIS — Z791 Long term (current) use of non-steroidal anti-inflammatories (NSAID): Secondary | ICD-10-CM | POA: Diagnosis not present

## 2015-06-30 DIAGNOSIS — Z79899 Other long term (current) drug therapy: Secondary | ICD-10-CM | POA: Diagnosis not present

## 2015-06-30 DIAGNOSIS — R0602 Shortness of breath: Secondary | ICD-10-CM | POA: Insufficient documentation

## 2015-06-30 DIAGNOSIS — R0789 Other chest pain: Secondary | ICD-10-CM | POA: Diagnosis not present

## 2015-06-30 DIAGNOSIS — R51 Headache: Secondary | ICD-10-CM | POA: Insufficient documentation

## 2015-06-30 DIAGNOSIS — M2550 Pain in unspecified joint: Secondary | ICD-10-CM | POA: Insufficient documentation

## 2015-06-30 LAB — CBC
HCT: 38 % (ref 36.0–46.0)
HEMOGLOBIN: 12.8 g/dL (ref 12.0–15.0)
MCH: 30.3 pg (ref 26.0–34.0)
MCHC: 33.7 g/dL (ref 30.0–36.0)
MCV: 90 fL (ref 78.0–100.0)
Platelets: 263 10*3/uL (ref 150–400)
RBC: 4.22 MIL/uL (ref 3.87–5.11)
RDW: 13.9 % (ref 11.5–15.5)
WBC: 10.8 10*3/uL — ABNORMAL HIGH (ref 4.0–10.5)

## 2015-06-30 NOTE — ED Notes (Signed)
Left arm pain yesterday, then left chest pressure and palpitations that started at 2100.  C/o SOB with pain.  Pt denies n/v, denies back pain, also c/o headache.

## 2015-06-30 NOTE — ED Provider Notes (Signed)
CSN: [id number]     Arrival date & time 06/30/15  2154 History  By signing my name below, I, Irene Pap, attest that this documentation has been prepared under the direction and in the presence of Merryl Hacker, MD. Electronically Signed: Irene Pap, ED Scribe. 06/30/2015. 11:42 PM.  Chief Complaint  Patient presents with  . Chest Pain   The history is provided by the patient. No language interpreter was used.  HPI Comments: Alexandria Herrera is a 45 y.o. female with a hx of hyperlipemia who presents to the Emergency Department complaining of intermittent left sided chest pressure onset 2 hours ago. Pt reports left arm pain that began yesterday With progressive chest pain, palpitations that began 2 hours ago, SOB with pain, and headache. Pt currently rates her pain 8/10 and denies worsening or alleviating factors. Pain is nonexertional. Described as pressure. Pt reports that grandmother had a heart attack when she was aged 29. She denies hx of similar symptoms, use of estrogen products, hx of blood clots, injury to the arm, leg swelling, or daily use of medications. Pt is allergic to morphine.   Past Medical History  Diagnosis Date  . Hyperlipemia 1996  . Ovarian cyst 1998, 2001    s/p  cyst removal   . S/P Botox injection 2012    face    Past Surgical History  Procedure Laterality Date  . Ovarian cyst removal    . Tubal ligation  2004  . Breast surgery Bilateral 2000    implants   . Abdominal hysterectomy     Family History  Problem Relation Age of Onset  . Hyperlipidemia Mother   . Hyperlipidemia Maternal Grandmother   . Heart disease Maternal Grandfather   . Heart disease Paternal Grandmother   . Cancer Neg Hx   . Healthy Son      x 3  . Healthy Daughter    Social History  Substance Use Topics  . Smoking status: Never Smoker   . Smokeless tobacco: Never Used  . Alcohol Use: No   OB History    Gravida Para Term Preterm AB TAB SAB Ectopic Multiple Living   4 4  4       4      Review of Systems  Constitutional: Negative for fever.  Respiratory: Positive for shortness of breath. Negative for cough.   Cardiovascular: Positive for chest pain and palpitations. Negative for leg swelling.  Gastrointestinal: Negative for nausea and vomiting.  Musculoskeletal: Positive for arthralgias. Negative for back pain.  Neurological: Positive for headaches.  All other systems reviewed and are negative.  Allergies  Morphine and related  Home Medications   Prior to Admission medications   Medication Sig Start Date End Date Taking? Authorizing Provider  cyclobenzaprine (FLEXERIL) 10 MG tablet Take 1 tablet by mouth at bedtime as needed. 04/21/14  Yes Historical Provider, MD  ibuprofen (ADVIL,MOTRIN) 200 MG tablet Take 200 mg by mouth every 6 (six) hours as needed.   Yes Historical Provider, MD  ferrous sulfate 325 (65 FE) MG tablet Take 1 tablet (325 mg total) by mouth 2 (two) times daily with a meal. Patient not taking: Reported on 06/30/2015 11/26/14   Boykin Nearing, MD  gabapentin (NEURONTIN) 100 MG capsule Take 1 capsule (100 mg total) by mouth 2 (two) times daily. Patient not taking: Reported on 06/30/2015 02/15/15   Alda Berthold, DO  tranexamic acid (LYSTEDA) 650 MG TABS tablet Take 2 tablets (1,300 mg total) by mouth 3 (three) times  daily. Patient not taking: Reported on 06/30/2015 01/12/15   Rachelle A Denney, CNM   BP 122/77 mmHg  Pulse 66  Temp(Src) 97.9 F (36.6 C) (Oral)  Resp 16  Ht 5\' 7"  (1.702 m)  Wt 155 lb (70.308 kg)  BMI 24.27 kg/m2  SpO2 100%  LMP 05/02/2015 Physical Exam  Constitutional: She is oriented to person, place, and time. She appears well-developed and well-nourished. No distress.  HENT:  Head: Normocephalic and atraumatic.  Cardiovascular: Normal rate, regular rhythm and normal heart sounds.   Pulmonary/Chest: Effort normal. No respiratory distress. She has no wheezes. She exhibits no tenderness.  Abdominal: Soft. Bowel sounds  are normal.  Musculoskeletal: She exhibits no edema.  Neurological: She is alert and oriented to person, place, and time.  Skin: Skin is warm and dry.  Psychiatric: She has a normal mood and affect.  Nursing note and vitals reviewed.   ED Course  Procedures (including critical care time) DIAGNOSTIC STUDIES: Oxygen Saturation is 100% on RA, normal by my interpretation.    COORDINATION OF CARE: 11:42 PM-Discussed treatment plan which includes labs, chest x-ray and EKG with pt at bedside and pt agreed to plan.    Labs Review Labs Reviewed  BASIC METABOLIC PANEL - Abnormal; Notable for the following:    Glucose, Bld 104 (*)    All other components within normal limits  CBC - Abnormal; Notable for the following:    WBC 10.8 (*)    All other components within normal limits  TROPONIN I    Imaging Review Dg Chest 2 View  06/30/2015  CLINICAL DATA:  Mid chest pressure. EXAM: CHEST - 2 VIEW COMPARISON:  10/09/2013 FINDINGS: The heart size and mediastinal contours are within normal limits. There is no evidence of pulmonary edema, consolidation, pneumothorax, nodule or pleural fluid. The visualized skeletal structures are unremarkable. IMPRESSION: No active disease. Electronically Signed   By: Aletta Edouard M.D.   On: 06/30/2015 22:30   I have personally reviewed and evaluated these images and lab results as part of my medical decision-making.   EKG Interpretation   Date/Time:  Wednesday June 30 2015 21:57:59 EDT Ventricular Rate:  70 PR Interval:  156 QRS Duration: 74 QT Interval:  386 QTC Calculation: 416 R Axis:   41 Text Interpretation:  Normal sinus rhythm Normal ECG Confirmed by Daquawn Seelman   MD, Latissa Frick ([id number]) on 06/30/2015 11:08:54 PM      MDM   Final diagnoses:  Other chest pain    Patient presents with left arm and chest pain. Progressive since yesterday. Not associated with exertion. Risk factor for ACS including hyperlipidemia plus or minus family history.  Otherwise low risk. EKG is normal without any ischemic changes. Troponin at 3 hours is negative. Basic labwork obtained and reassuring. Patient was given an aspirin. On recheck, she reports resolution of symptoms. Heart score is 1 for risk factors.  Given the pain is resolved, will have patient follow-up with cardiology for formal stress testing. She is PERC negative.  After history, exam, and medical workup I feel the patient has been appropriately medically screened and is safe for discharge home. Pertinent diagnoses were discussed with the patient. Patient was given return precautions.  I personally performed the services described in this documentation, which was scribed in my presence. The recorded information has been reviewed and is accurate.    Merryl Hacker, MD 07/01/15 [phone]

## 2015-07-01 LAB — BASIC METABOLIC PANEL
ANION GAP: 6 (ref 5–15)
BUN: 12 mg/dL (ref 6–20)
CALCIUM: 9.3 mg/dL (ref 8.9–10.3)
CO2: 25 mmol/L (ref 22–32)
Chloride: 106 mmol/L (ref 101–111)
Creatinine, Ser: 0.48 mg/dL (ref 0.44–1.00)
Glucose, Bld: 104 mg/dL — ABNORMAL HIGH (ref 65–99)
Potassium: 4.1 mmol/L (ref 3.5–5.1)
Sodium: 137 mmol/L (ref 135–145)

## 2015-07-01 LAB — TROPONIN I

## 2015-07-01 MED ORDER — ASPIRIN 81 MG PO CHEW
324.0000 mg | CHEWABLE_TABLET | Freq: Once | ORAL | Status: AC
  Administered 2015-07-01: 324 mg via ORAL
  Filled 2015-07-01: qty 4

## 2015-07-01 NOTE — Discharge Instructions (Signed)
You were seen today for chest pain. Your workup is largely reassuring. You need follow-up with cardiology for formal stress testing.  Nonspecific Chest Pain  Chest pain can be caused by many different conditions. There is always a chance that your pain could be related to something serious, such as a heart attack or a blood clot in your lungs. Chest pain can also be caused by conditions that are not life-threatening. If you have chest pain, it is very important to follow up with your health care provider. CAUSES  Chest pain can be caused by:  Heartburn.  Pneumonia or bronchitis.  Anxiety or stress.  Inflammation around your heart (pericarditis) or lung (pleuritis or pleurisy).  A blood clot in your lung.  A collapsed lung (pneumothorax). It can develop suddenly on its own (spontaneous pneumothorax) or from trauma to the chest.  Shingles infection (varicella-zoster virus).  Heart attack.  Damage to the bones, muscles, and cartilage that make up your chest wall. This can include:  Bruised bones due to injury.  Strained muscles or cartilage due to frequent or repeated coughing or overwork.  Fracture to one or more ribs.  Sore cartilage due to inflammation (costochondritis). RISK FACTORS  Risk factors for chest pain may include:  Activities that increase your risk for trauma or injury to your chest.  Respiratory infections or conditions that cause frequent coughing.  Medical conditions or overeating that can cause heartburn.  Heart disease or family history of heart disease.  Conditions or health behaviors that increase your risk of developing a blood clot.  Having had chicken pox (varicella zoster). SIGNS AND SYMPTOMS Chest pain can feel like:  Burning or tingling on the surface of your chest or deep in your chest.  Crushing, pressure, aching, or squeezing pain.  Dull or sharp pain that is worse when you move, cough, or take a deep breath.  Pain that is also felt  in your back, neck, shoulder, or arm, or pain that spreads to any of these areas. Your chest pain may come and go, or it may stay constant. DIAGNOSIS Lab tests or other studies may be needed to find the cause of your pain. Your health care provider may have you take a test called an ambulatory ECG (electrocardiogram). An ECG records your heartbeat patterns at the time the test is performed. You may also have other tests, such as:  Transthoracic echocardiogram (TTE). During echocardiography, sound waves are used to create a picture of all of the heart structures and to look at how blood flows through your heart.  Transesophageal echocardiogram (TEE).This is a more advanced imaging test that obtains images from inside your body. It allows your health care provider to see your heart in finer detail.  Cardiac monitoring. This allows your health care provider to monitor your heart rate and rhythm in real time.  Holter monitor. This is a portable device that records your heartbeat and can help to diagnose abnormal heartbeats. It allows your health care provider to track your heart activity for several days, if needed.  Stress tests. These can be done through exercise or by taking medicine that makes your heart beat more quickly.  Blood tests.  Imaging tests. TREATMENT  Your treatment depends on what is causing your chest pain. Treatment may include:  Medicines. These may include:  Acid blockers for heartburn.  Anti-inflammatory medicine.  Pain medicine for inflammatory conditions.  Antibiotic medicine, if an infection is present.  Medicines to dissolve blood clots.  Medicines to  treat coronary artery disease.  Supportive care for conditions that do not require medicines. This may include:  Resting.  Applying heat or cold packs to injured areas.  Limiting activities until pain decreases. HOME CARE INSTRUCTIONS  If you were prescribed an antibiotic medicine, finish it all even if  you start to feel better.  Avoid any activities that bring on chest pain.  Do not use any tobacco products, including cigarettes, chewing tobacco, or electronic cigarettes. If you need help quitting, ask your health care provider.  Do not drink alcohol.  Take medicines only as directed by your health care provider.  Keep all follow-up visits as directed by your health care provider. This is important. This includes any further testing if your chest pain does not go away.  If heartburn is the cause for your chest pain, you may be told to keep your head raised (elevated) while sleeping. This reduces the chance that acid will go from your stomach into your esophagus.  Make lifestyle changes as directed by your health care provider. These may include:  Getting regular exercise. Ask your health care provider to suggest some activities that are safe for you.  Eating a heart-healthy diet. A registered dietitian can help you to learn healthy eating options.  Maintaining a healthy weight.  Managing diabetes, if necessary.  Reducing stress. SEEK MEDICAL CARE IF:  Your chest pain does not go away after treatment.  You have a rash with blisters on your chest.  You have a fever. SEEK IMMEDIATE MEDICAL CARE IF:   Your chest pain is worse.  You have an increasing cough, or you cough up blood.  You have severe abdominal pain.  You have severe weakness.  You faint.  You have chills.  You have sudden, unexplained chest discomfort.  You have sudden, unexplained discomfort in your arms, back, neck, or jaw.  You have shortness of breath at any time.  You suddenly start to sweat, or your skin gets clammy.  You feel nauseous or you vomit.  You suddenly feel light-headed or dizzy.  Your heart begins to beat quickly, or it feels like it is skipping beats. These symptoms may represent a serious problem that is an emergency. Do not wait to see if the symptoms will go away. Get medical  help right away. Call your local emergency services (911 in the [place] Do not drive yourself to the hospital.   This information is not intended to replace advice given to you by your health care provider. Make sure you discuss any questions you have with your health care provider.   Document Released: 12/21/2004 Document Revised: 04/03/2014 Document Reviewed: 10/17/2013 Elsevier Interactive Patient Education [employer].

## 2015-07-01 NOTE — ED Notes (Signed)
Pt verbalizes understanding of d/c instructions and denies any further needs at this time. 

## 2015-07-26 ENCOUNTER — Ambulatory Visit: Payer: Self-pay | Admitting: Cardiology

## 2015-07-29 ENCOUNTER — Encounter: Payer: Self-pay | Admitting: Cardiology

## 2016-02-05 ENCOUNTER — Encounter (HOSPITAL_BASED_OUTPATIENT_CLINIC_OR_DEPARTMENT_OTHER): Payer: Self-pay | Admitting: *Deleted

## 2016-02-05 ENCOUNTER — Emergency Department (HOSPITAL_BASED_OUTPATIENT_CLINIC_OR_DEPARTMENT_OTHER)
Admission: EM | Admit: 2016-02-05 | Discharge: 2016-02-05 | Disposition: A | Discharge status: Home / Self Care | Payer: Medicaid Other | Attending: Emergency Medicine | Admitting: Emergency Medicine

## 2016-02-05 ENCOUNTER — Emergency Department (HOSPITAL_BASED_OUTPATIENT_CLINIC_OR_DEPARTMENT_OTHER): Payer: Medicaid Other

## 2016-02-05 DIAGNOSIS — R220 Localized swelling, mass and lump, head: Secondary | ICD-10-CM | POA: Insufficient documentation

## 2016-02-05 DIAGNOSIS — Z791 Long term (current) use of non-steroidal anti-inflammatories (NSAID): Secondary | ICD-10-CM | POA: Insufficient documentation

## 2016-02-05 DIAGNOSIS — Z79899 Other long term (current) drug therapy: Secondary | ICD-10-CM | POA: Diagnosis not present

## 2016-02-05 HISTORY — DX: Other specified diseases of liver: K76.89

## 2016-02-05 LAB — BASIC METABOLIC PANEL
ANION GAP: 6 (ref 5–15)
BUN: 12 mg/dL (ref 6–20)
CALCIUM: 9.6 mg/dL (ref 8.9–10.3)
CO2: 25 mmol/L (ref 22–32)
Chloride: 107 mmol/L (ref 101–111)
Creatinine, Ser: 0.69 mg/dL (ref 0.44–1.00)
GFR calc Af Amer: >60 mL/min (ref >60)
GLUCOSE: 92 mg/dL (ref 65–99)
POTASSIUM: 3.7 mmol/L (ref 3.5–5.1)
Sodium: 138 mmol/L (ref 135–145)

## 2016-02-05 LAB — CBC WITH DIFFERENTIAL/PLATELET
Basophils Absolute: 0 10*3/uL (ref 0.0–0.1)
Basophils Relative: 0 %
EOS PCT: 16 %
Eosinophils Absolute: 1.3 10*3/uL — ABNORMAL HIGH (ref 0.0–0.7)
HCT: 39.4 % (ref 36.0–46.0)
Hemoglobin: 13.2 g/dL (ref 12.0–15.0)
LYMPHS PCT: 29 %
Lymphs Abs: 2.3 10*3/uL (ref 0.7–4.0)
MCH: 29.2 pg (ref 26.0–34.0)
MCHC: 33.5 g/dL (ref 30.0–36.0)
MCV: 87.2 fL (ref 78.0–100.0)
MONO ABS: 0.5 10*3/uL (ref 0.1–1.0)
MONOS PCT: 6 %
Neutro Abs: 4 10*3/uL (ref 1.7–7.7)
Neutrophils Relative %: 49 %
PLATELETS: 258 10*3/uL (ref 150–400)
RBC: 4.52 MIL/uL (ref 3.87–5.11)
RDW: 13.6 % (ref 11.5–15.5)
WBC: 8 10*3/uL (ref 4.0–10.5)

## 2016-02-05 MED ORDER — IOPAMIDOL (ISOVUE-300) INJECTION 61%
80.0000 mL | Freq: Once | INTRAVENOUS | Status: AC | PRN
  Administered 2016-02-05: 100 mL via INTRAVENOUS

## 2016-02-05 NOTE — ED Provider Notes (Signed)
[place] DEPT [facility] Provider Note   CSN: [id number] Arrival date & time: 02/05/16  0813     History   Chief Complaint Chief Complaint  Patient presents with  . Facial Swelling    HPI Alexandria Herrera is a 45 y.o. female.  45yo F w/ h/o hysterectomy, HLD who p/w facial swelling. 4 years ago, she had collagen injections in her cheeks in [place]. She states the collagen was called "Bio 300." She states that for a long time, every month or 2 she has swelling in the areas where she had the previous injections but it usually spontaneously resolves. Approximately one week ago, the swelling began again but got worse and has not resolved. She reports soreness and firmness around her cheeks and upper lip. She has taken ibuprofen and Zyrtec without relief. The areas itch and hurt but she denies any drainage. No fever, dental pain, vomiting, or recent illness. She denies any other procedures on her face or other injections since 4 years ago. She has never been evaluated for this problem. No breathing problems.   The history is provided by the patient.    Past Medical History:  Diagnosis Date  . Hyperlipemia 1996  . Liver cyst   . Ovarian cyst 1998, 2001   s/p  cyst removal   . S/P Botox injection 2012   face     Patient Active Problem List   Diagnosis Date Noted  . Meralgia paresthetica 02/15/2015  . BV (bacterial vaginosis) 11/26/2014  . 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 Uterine fibroid 11/25/2014  . Pap smear for cervical cancer screening 11/25/2014  . Anemia 11/25/2014  . GERD (gastroesophageal reflux disease) 11/25/2014  . Constipation 11/14/2013  . Liver lesion, right lobe 10/11/2013  . Swelling of face 10/10/2013  . Dyslipidemia 10/10/2013  . Palpitations 10/09/2013    Past Surgical History:  Procedure Laterality Date  . ABDOMINAL HYSTERECTOMY    . BREAST SURGERY Bilateral 2000   implants   . OVARIAN CYST REMOVAL    . TUBAL LIGATION  2004    OB  History    Gravida Para Term Preterm AB Living   4 4 4     4    SAB TAB Ectopic Multiple Live Births                   Home Medications    Prior to Admission medications   Medication Sig Start Date End Date Taking? Authorizing Provider  cyclobenzaprine (FLEXERIL) 10 MG tablet Take 1 tablet by mouth at bedtime as needed. 04/21/14   Historical Provider, MD  ferrous sulfate 325 (65 FE) MG tablet Take 1 tablet (325 mg total) by mouth 2 (two) times daily with a meal. Patient not taking: Reported on 06/30/2015 11/26/14   Boykin Nearing, MD  gabapentin (NEURONTIN) 100 MG capsule Take 1 capsule (100 mg total) by mouth 2 (two) times daily. Patient not taking: Reported on 06/30/2015 02/15/15   Alda Berthold, DO  ibuprofen (ADVIL,MOTRIN) 200 MG tablet Take 200 mg by mouth every 6 (six) hours as needed.    Historical Provider, MD  tranexamic acid (LYSTEDA) 650 MG TABS tablet Take 2 tablets (1,300 mg total) by mouth 3 (three) times daily. Patient not taking: Reported on 06/30/2015 01/12/15   Morene Crocker, CNM    Family History Family History  Problem Relation Age of Onset  . Hyperlipidemia Mother   . Hyperlipidemia Maternal Grandmother   . Heart disease Maternal Grandfather   . Heart  disease Paternal Grandmother   . Healthy Son      x 3  . Healthy Daughter   . Cancer Neg Hx     Social History Social History  Substance Use Topics  . Smoking status: Never Smoker  . Smokeless tobacco: Never Used  . Alcohol use No     Allergies   Morphine and related   Review of Systems Review of Systems 10 Systems reviewed and are negative for acute change except as noted in the HPI.   Physical Exam Updated Vital Signs BP 107/67 (BP Location: Right Arm)   Pulse 67   Temp 98.8 F (37.1 C) (Oral)   Resp 20   Ht 5\' 8"  (1.727 m)   Wt 155 lb (70.3 kg)   LMP 05/02/2015 Comment: bilat oophorectomies//a.c.  SpO2 99%   BMI 23.57 kg/m   Physical Exam  Constitutional: She is oriented to person,  place, and time. She appears well-developed and well-nourished. No distress.  HENT:  Head: Normocephalic and atraumatic.  Moist mucous membranes Normal dentition Swelling of upper lip, extending to below nose and up medial cheeks; areas of swelling are firm to touch but no redness or fluctuance  Eyes: Conjunctivae are normal. Pupils are equal, round, and reactive to light.  Neck: Neck supple.  Cardiovascular: Normal rate, regular rhythm and normal heart sounds.   No murmur heard. Pulmonary/Chest: Effort normal and breath sounds normal.  Abdominal: Soft. Bowel sounds are normal. She exhibits no distension. There is no tenderness.  Musculoskeletal: She exhibits no edema.  Neurological: She is alert and oriented to person, place, and time.  Fluent speech  Skin: Skin is warm and dry.  Edema without redness or skin changes on medial cheeks, above upper lip extending to periorbital areas   Psychiatric: She has a normal mood and affect. Judgment normal.  Nursing note and vitals reviewed.    ED Treatments / Results  Labs (all labs ordered are listed, but only abnormal results are displayed) Labs Reviewed  CBC WITH DIFFERENTIAL/PLATELET - Abnormal; Notable for the following:       Result Value   Eosinophils Absolute 1.3 (*)    All other components within normal limits  BASIC METABOLIC PANEL    EKG  EKG Interpretation None       Radiology Ct Maxillofacial W Contrast  Result Date: 02/05/2016 CLINICAL DATA:  Lower facial swelling from nose to chin bilaterally. History of collagen injections for years ago. EXAM: CT MAXILLOFACIAL WITH CONTRAST TECHNIQUE: Multidetector CT imaging of the maxillofacial structures was performed with intravenous contrast. Multiplanar CT image reconstructions were also generated. A small metallic BB was placed on the right temple in order to reliably differentiate right from left. CONTRAST:  18mL ISOVUE-300 IOPAMIDOL (ISOVUE-300) INJECTION 61% COMPARISON:   None. FINDINGS: Osseous: The underlying bones are normal in appearance. Orbits: Intact Sinuses: Mild mucosal thickening in the maxillary, ethmoid, and frontal sinuses. Soft tissues: There is increased attenuation, 87 Hounsfield units, which extends from the lateral aspect of the base of the nose, anterior to the maxillary sinuses, and anterior to the maxilla. There is also mild increased attenuation in the adjacent fat with overlying skin thickening. These findings are less prominent overlying the mandible towards the chin but there is some increased attenuation in the fat at the level of the mandible and chin. These findings are seen bilaterally with mild overlying skin thickening. No abscess identified. The globes are intact. No other soft tissue abnormalities. Limited intracranial: No significant or unexpected finding.  IMPRESSION: There is a band of increased attenuation over the medial aspects of the face bilaterally with mild adjacent fat stranding and overlying skin thickening. The findings are consistent with an inflammatory or infectious process. No abscess identified. Given the distribution, this could represent a delayed reaction from the patient's previous collagen injections in the appropriate clinical setting. Electronically Signed   By: Dorise Bullion III M.D   On: 02/05/2016 11:36    Procedures Procedures (including critical care time)  Medications Ordered in ED Medications  iopamidol (ISOVUE-300) 61 % injection 80 mL (100 mLs Intravenous Contrast Given 02/05/16 1105)     Initial Impression / Assessment and Plan / ED Course  I have reviewed the triage vital signs and the nursing notes.  Pertinent labs & imaging results that were available during my care of the patient were reviewed by me and considered in my medical decision making (see chart for details).  Clinical Course     Pt w/ h/o collagen injection to face 4 years ago p/w intermittent swelling in these areas, persistent  swelling x1 week. She was awake and alert, breathing comfortably, NAD w/ normal VS. Edema to medial cheeks and extending down to above upper lip. Areas were firm but not erythematous or warm, not c/w cellulitis. Labs unremarkable. Obtained CT face for better evaluation. CT shows inflammatory process and skin thickening in medial face, no fluid collections. I suspect delayed reaction to substance injected into her face. I discussed w/ plastic surgeon Dr. Marla Roe, who stated that reaction to collagen this far out would be highly unusual, however could be feasible if substance was something other than collagen (eg silicone.) She recommended no medications, including no steroids, but ice to face, follow up with plastic surgeon. Pt unable to obtain records from [place] facility that did injections as it closed down. I discussed supportive care w/ pt and provided her w/ f/u info for [facility]. Return precautions reviewed and patient voiced understanding. Patient discharged in satisfactory condition.  Final Clinical Impressions(s) / ED Diagnoses   Final diagnoses:  Facial swelling    New Prescriptions Discharge Medication List as of 02/05/2016  1:11 PM       Sharlett Iles, MD 02/06/16 [id number]

## 2016-02-05 NOTE — Discharge Instructions (Signed)
Take benadryl as directed on bottle for swelling. Apply ice to your face and do not lay flat at night. Contact the plastic surgery clinic below to schedule an appointment with Dr. Luetta Nutting or Dr. Eugene Garnet at [facility] at next available appointment.  Seek immediate medical attention if you have any tongue swelling, difficulty breathing, or fever.

## 2016-02-05 NOTE — ED Triage Notes (Signed)
Patient states she has had mid and lower facial swelling for one week.  States over the last two days the swelling has gotten worse.  Took Ibuprofen and zyrtec yesterday with no relief. Patient had collagen injections in the upper lip four years ago.

## 2016-08-09 ENCOUNTER — Encounter: Payer: Self-pay | Admitting: Family Medicine

## 2016-10-24 ENCOUNTER — Emergency Department (HOSPITAL_BASED_OUTPATIENT_CLINIC_OR_DEPARTMENT_OTHER)
Admission: EM | Admit: 2016-10-24 | Discharge: 2016-10-24 | Disposition: A | Discharge status: Home / Self Care | Payer: Managed Care, Other (non HMO) | Attending: Emergency Medicine | Admitting: Emergency Medicine

## 2016-10-24 ENCOUNTER — Emergency Department (HOSPITAL_BASED_OUTPATIENT_CLINIC_OR_DEPARTMENT_OTHER): Payer: Managed Care, Other (non HMO)

## 2016-10-24 ENCOUNTER — Encounter (HOSPITAL_BASED_OUTPATIENT_CLINIC_OR_DEPARTMENT_OTHER): Payer: Self-pay | Admitting: Emergency Medicine

## 2016-10-24 DIAGNOSIS — R1013 Epigastric pain: Secondary | ICD-10-CM | POA: Insufficient documentation

## 2016-10-24 DIAGNOSIS — Z79899 Other long term (current) drug therapy: Secondary | ICD-10-CM | POA: Insufficient documentation

## 2016-10-24 LAB — URINALYSIS, MICROSCOPIC (REFLEX)

## 2016-10-24 LAB — COMPREHENSIVE METABOLIC PANEL
ALBUMIN: 4 g/dL (ref 3.5–5.0)
ALT: 18 U/L (ref 14–54)
ANION GAP: 7 (ref 5–15)
AST: 24 U/L (ref 15–41)
Alkaline Phosphatase: 69 U/L (ref 38–126)
BUN: 8 mg/dL (ref 6–20)
CHLORIDE: 104 mmol/L (ref 101–111)
CO2: 26 mmol/L (ref 22–32)
Calcium: 9.2 mg/dL (ref 8.9–10.3)
Creatinine, Ser: 0.65 mg/dL (ref 0.44–1.00)
GFR calc Af Amer: >60 mL/min (ref >60)
GLUCOSE: 82 mg/dL (ref 65–99)
Potassium: 3.7 mmol/L (ref 3.5–5.1)
Sodium: 137 mmol/L (ref 135–145)
Total Bilirubin: 0.3 mg/dL (ref 0.3–1.2)
Total Protein: 7.1 g/dL (ref 6.5–8.1)

## 2016-10-24 LAB — URINALYSIS, ROUTINE W REFLEX MICROSCOPIC
Bilirubin Urine: NEGATIVE
GLUCOSE, UA: NEGATIVE mg/dL
Ketones, ur: NEGATIVE mg/dL
NITRITE: NEGATIVE
PH: 6.5 (ref 5.0–8.0)
Protein, ur: NEGATIVE mg/dL
SPECIFIC GRAVITY, URINE: 1.021 (ref 1.005–1.030)

## 2016-10-24 LAB — CBC
HEMATOCRIT: 40.3 % (ref 36.0–46.0)
HEMOGLOBIN: 13.7 g/dL (ref 12.0–15.0)
MCH: 29.8 pg (ref 26.0–34.0)
MCHC: 34 g/dL (ref 30.0–36.0)
MCV: 87.6 fL (ref 78.0–100.0)
Platelets: 250 10*3/uL (ref 150–400)
RBC: 4.6 MIL/uL (ref 3.87–5.11)
RDW: 13.7 % (ref 11.5–15.5)
WBC: 10.5 10*3/uL (ref 4.0–10.5)

## 2016-10-24 LAB — LIPASE, BLOOD: LIPASE: 28 U/L (ref 11–51)

## 2016-10-24 MED ORDER — TRAMADOL HCL 50 MG PO TABS: 50.0000 mg | ORAL_TABLET | Freq: Four times a day (QID) | ORAL | 0 refills | Status: DC | PRN

## 2016-10-24 MED ORDER — OMEPRAZOLE 20 MG PO CPDR: 20.0000 mg | DELAYED_RELEASE_CAPSULE | Freq: Two times a day (BID) | ORAL | 0 refills | Status: DC

## 2016-10-24 MED FILL — OMEPRAZOLE DR 20 MG CAPSULE: 20 | 15 days supply | Qty: 30 | Fill #0

## 2016-10-24 MED FILL — traMADol HCL 50 MG TABS: 50 | 4 days supply | Qty: 15 | Fill #0

## 2016-10-24 NOTE — ED Provider Notes (Signed)
[place] DEPT [facility] Provider Note   CSN: [id number] Arrival date & time: 10/24/16  0844     History   Chief Complaint Chief Complaint  Patient presents with  . Abdominal Pain    HPI Alexandria Herrera is a 46 y.o. female.  Patient is a 46 year old female with history of hysterectomy presenting for evaluation of epigastric pain. She reports this has been occurring intermittently for the past 2 weeks. It is worse when she eats, specifically fried foods. She denies any fevers or chills. She denies any diarrhea or constipation.   The history is provided by the patient.  Abdominal Pain   This is a new problem. Episode onset: 2 weeks ago. Episode frequency: Intermittently. The problem has been gradually worsening. The pain is associated with eating. The pain is located in the RUQ and epigastric region. The pain is moderate. Associated symptoms include nausea. Pertinent negatives include fever and diarrhea. The symptoms are aggravated by eating. Nothing relieves the symptoms.    Past Medical History:  Diagnosis Date  . Hyperlipemia 1996  . Liver cyst   . Ovarian cyst 1998, 2001   s/p  cyst removal   . S/P Botox injection 2012   face     Patient Active Problem List   Diagnosis Date Noted  . Meralgia paresthetica 02/15/2015  . BV (bacterial vaginosis) 11/26/2014  . 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 Uterine fibroid 11/25/2014  . Pap smear for cervical cancer screening 11/25/2014  . Anemia 11/25/2014  . GERD (gastroesophageal reflux disease) 11/25/2014  . Constipation 11/14/2013  . Liver lesion, right lobe 10/11/2013  . Swelling of face 10/10/2013  . Dyslipidemia 10/10/2013  . Palpitations 10/09/2013    Past Surgical History:  Procedure Laterality Date  . ABDOMINAL HYSTERECTOMY    . BREAST SURGERY Bilateral 2000   implants   . OVARIAN CYST REMOVAL    . TUBAL LIGATION  2004    OB History    Gravida Para Term Preterm AB Living   4 4 4     4    SAB  TAB Ectopic Multiple Live Births                   Home Medications    Prior to Admission medications   Medication Sig Start Date End Date Taking? Authorizing Provider  cyclobenzaprine (FLEXERIL) 10 MG tablet Take 1 tablet by mouth at bedtime as needed. 04/21/14   [provider]  ferrous sulfate 325 (65 FE) MG tablet Take 1 tablet (325 mg total) by mouth 2 (two) times daily with a meal. Patient not taking: Reported on 06/30/2015 11/26/14   Boykin Nearing, MD  gabapentin (NEURONTIN) 100 MG capsule Take 1 capsule (100 mg total) by mouth 2 (two) times daily. Patient not taking: Reported on 06/30/2015 02/15/15   Narda Amber K, DO  ibuprofen (ADVIL,MOTRIN) 200 MG tablet Take 200 mg by mouth every 6 (six) hours as needed.    [provider]  tranexamic acid (LYSTEDA) 650 MG TABS tablet Take 2 tablets (1,300 mg total) by mouth 3 (three) times daily. Patient not taking: Reported on 06/30/2015 01/12/15   Morene Crocker, CNM    Family History Family History  Problem Relation Age of Onset  . Hyperlipidemia Mother   . Hyperlipidemia Maternal Grandmother   . Heart disease Maternal Grandfather   . Heart disease Paternal Grandmother   . Healthy Son         x 3  . Healthy Daughter   .  Cancer Neg Hx     Social History Social History  Substance Use Topics  . Smoking status: Never Smoker  . Smokeless tobacco: Never Used  . Alcohol use No     Allergies   Morphine and related   Review of Systems Review of Systems  Constitutional: Negative for fever.  Gastrointestinal: Positive for abdominal pain and nausea. Negative for diarrhea.  All other systems reviewed and are negative.    Physical Exam Updated Vital Signs BP 111/79 (BP Location: Right Arm)   Pulse 92   Temp 98.2 F (36.8 C) (Oral)   Resp 16   Ht 5\' 7"  (1.702 m)   Wt 68 kg (150 lb)   LMP 05/02/2015 Comment: bilat oophorectomies//a.c.  SpO2 100%   BMI 23.49 kg/m   Physical Exam  Constitutional:  She is oriented to person, place, and time. She appears well-developed and well-nourished. No distress.  HENT:  Head: Normocephalic and atraumatic.  Neck: Normal range of motion. Neck supple.  Cardiovascular: Normal rate and regular rhythm.  Exam reveals no gallop and no friction rub.   No murmur heard. Pulmonary/Chest: Effort normal and breath sounds normal. No respiratory distress. She has no wheezes.  Abdominal: Soft. Bowel sounds are normal. She exhibits no distension. There is tenderness. There is no rebound and no guarding.  There is tenderness to palpation in the epigastric region.  Musculoskeletal: Normal range of motion.  Neurological: She is alert and oriented to person, place, and time.  Skin: Skin is warm and dry. She is not diaphoretic.  Nursing note and vitals reviewed.    ED Treatments / Results  Labs (all labs ordered are listed, but only abnormal results are displayed) Labs Reviewed  LIPASE, BLOOD  COMPREHENSIVE METABOLIC PANEL  CBC  URINALYSIS, ROUTINE W REFLEX MICROSCOPIC    EKG  EKG Interpretation None       Radiology No results found.  Procedures Procedures (including critical care time)  Medications Ordered in ED Medications - No data to display   Initial Impression / Assessment and Plan / ED Course  I have reviewed the triage vital signs and the nursing notes.  Pertinent labs & imaging results that were available during my care of the patient were reviewed by me and considered in my medical decision making (see chart for details).  Patient's ultrasound and laboratory studies are negative for cholelithiasis or other significant pathology. Her symptoms are likely related to gastritis. She will be treated with Prilosec, Carafate, pain medicine, and is to follow-up with GI if not improving in the next 1-2 weeks. I have also ordered an outpatient HIDA scan to further evaluate her gallbladder.  Final Clinical Impressions(s) / ED Diagnoses   Final  diagnoses:  Epigastric pain    New Prescriptions New Prescriptions   No medications on file     Veryl Speak, MD 10/24/16 1527

## 2016-10-24 NOTE — Discharge Instructions (Signed)
Prilosec as prescribed. Tramadol as prescribed as needed for pain.  You have been scheduled for a HIDA scan as an outpatient to further evaluate your gallbladder.  Follow-up with [facility] gastroenterology in the next week, and return to the emergency department if you develop worsening pain, high fevers, bloody stool or vomit, or other new and concerning symptoms.

## 2016-10-24 NOTE — ED Triage Notes (Signed)
Upper abd/epigastric pain x 2 weeks, worse with eating.

## 2016-10-24 NOTE — ED Notes (Signed)
Patient transported to Ultrasound 

## 2016-10-24 NOTE — ED Notes (Signed)
ED Provider at bedside. 

## 2017-09-05 ENCOUNTER — Encounter (HOSPITAL_BASED_OUTPATIENT_CLINIC_OR_DEPARTMENT_OTHER): Payer: Self-pay

## 2017-09-05 ENCOUNTER — Emergency Department (HOSPITAL_BASED_OUTPATIENT_CLINIC_OR_DEPARTMENT_OTHER)
Admission: EM | Admit: 2017-09-05 | Discharge: 2017-09-05 | Disposition: A | Discharge status: Home / Self Care | Payer: BLUE CROSS/BLUE SHIELD | Attending: Emergency Medicine | Admitting: Emergency Medicine

## 2017-09-05 ENCOUNTER — Emergency Department (HOSPITAL_BASED_OUTPATIENT_CLINIC_OR_DEPARTMENT_OTHER): Payer: BLUE CROSS/BLUE SHIELD

## 2017-09-05 ENCOUNTER — Other Ambulatory Visit: Payer: Self-pay

## 2017-09-05 DIAGNOSIS — G43009 Migraine without aura, not intractable, without status migrainosus: Secondary | ICD-10-CM | POA: Diagnosis not present

## 2017-09-05 DIAGNOSIS — Z79899 Other long term (current) drug therapy: Secondary | ICD-10-CM | POA: Diagnosis not present

## 2017-09-05 DIAGNOSIS — R51 Headache: Secondary | ICD-10-CM | POA: Diagnosis present

## 2017-09-05 LAB — BASIC METABOLIC PANEL
Anion gap: 8 (ref 5–15)
BUN: 10 mg/dL (ref 6–20)
CALCIUM: 9.3 mg/dL (ref 8.9–10.3)
CO2: 27 mmol/L (ref 22–32)
Chloride: 104 mmol/L (ref 101–111)
Creatinine, Ser: 0.59 mg/dL (ref 0.44–1.00)
GFR calc Af Amer: >60 mL/min (ref >60)
GFR calc non Af Amer: >60 mL/min (ref >60)
GLUCOSE: 97 mg/dL (ref 65–99)
POTASSIUM: 3.6 mmol/L (ref 3.5–5.1)
SODIUM: 139 mmol/L (ref 135–145)

## 2017-09-05 LAB — CBC WITH DIFFERENTIAL/PLATELET
BASOS PCT: 0 %
Basophils Absolute: 0 10*3/uL (ref 0.0–0.1)
EOS PCT: 20 %
Eosinophils Absolute: 2.1 10*3/uL — ABNORMAL HIGH (ref 0.0–0.7)
HEMATOCRIT: 41.1 % (ref 36.0–46.0)
Hemoglobin: 13.9 g/dL (ref 12.0–15.0)
LYMPHS PCT: 32 %
Lymphs Abs: 3.5 10*3/uL (ref 0.7–4.0)
MCH: 30.2 pg (ref 26.0–34.0)
MCHC: 33.8 g/dL (ref 30.0–36.0)
MCV: 89.3 fL (ref 78.0–100.0)
MONO ABS: 0.4 10*3/uL (ref 0.1–1.0)
MONOS PCT: 4 %
NEUTROS ABS: 4.9 10*3/uL (ref 1.7–7.7)
Neutrophils Relative %: 44 %
PLATELETS: 256 10*3/uL (ref 150–400)
RBC: 4.6 MIL/uL (ref 3.87–5.11)
RDW: 12.9 % (ref 11.5–15.5)
WBC: 10.9 10*3/uL — ABNORMAL HIGH (ref 4.0–10.5)

## 2017-09-05 LAB — TROPONIN I: Troponin I: <0.03 ng/mL (ref <0.03)

## 2017-09-05 MED ORDER — DIPHENHYDRAMINE HCL 50 MG/ML IJ SOLN
12.5000 mg | Freq: Once | INTRAMUSCULAR | Status: AC
  Administered 2017-09-05: 12.5 mg via INTRAVENOUS
  Filled 2017-09-05: qty 1

## 2017-09-05 MED ORDER — SODIUM CHLORIDE 0.9 % IV BOLUS
1000.0000 mL | Freq: Once | INTRAVENOUS | Status: AC
  Administered 2017-09-05: 1000 mL via INTRAVENOUS

## 2017-09-05 MED ORDER — BUTALBITAL-APAP-CAFFEINE 50-325-40 MG PO TABS: 1.0000 | ORAL_TABLET | Freq: Four times a day (QID) | ORAL | 0 refills | Status: DC | PRN

## 2017-09-05 MED ORDER — PROCHLORPERAZINE EDISYLATE 10 MG/2ML IJ SOLN
10.0000 mg | Freq: Once | INTRAMUSCULAR | Status: AC
  Administered 2017-09-05: 10 mg via INTRAVENOUS
  Filled 2017-09-05: qty 2

## 2017-09-05 NOTE — Discharge Instructions (Signed)
Return to ED for any worsening symptoms, head injuries or falls, numbness in arms or legs, neck pain with fever, severe chest pain or coughing up blood.

## 2017-09-05 NOTE — ED Provider Notes (Signed)
[facility] EMERGENCY DEPARTMENT Provider Note   CSN: [id number] Arrival date & time: 09/05/17  1633     History   Chief Complaint Chief Complaint  Patient presents with  . Headache    HPI Alexandria Herrera is a 47 y.o. female with past medical history of hyperlipidemia, who presents to ED for evaluation of multiple complaints.  Her first complaint is headache for the past week and a half.  Headache is associated with photophobia, photophobia, intermittent blurry vision and nausea.  She had a history of migraines in the past but states that this is not being alleviated with her Aleve.  States that pain is worse in the morning and when she looks down.  She denies any injuries or falls, numbness in arms or legs, vomiting, fever, neck pain. Her next complaint is left arm pain.  States the pain starts in her shoulder and radiates down her left arm.  She reports shortness of breath as well but denies any chest pain.  Pain is usually worse at night after work.  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  She denies any injuries or falls, changes to sensation of arm, history of gout, prior fracture, dislocations or procedures in the area. Is also concerned because she checked her blood pressure yesterday and it was in the 191Y systolic.  She was seen by her PCP today and sent here for further evaluation.  Denies any prior MI, DVT or PE, recent surgeries, recent prolonged travel, family or personal history of aneurysms.  HPI  Past Medical History:  Diagnosis Date  . Hyperlipemia 1996  . Liver cyst   . Ovarian cyst 1998, 2001   s/p  cyst removal   . S/P Botox injection 2012   face     Patient Active Problem List   Diagnosis Date Noted  . Meralgia paresthetica 02/15/2015  . BV (bacterial vaginosis) 11/26/2014  . 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 Uterine fibroid 11/25/2014  . Pap smear for cervical cancer screening 11/25/2014  . Anemia  11/25/2014  . GERD (gastroesophageal reflux disease) 11/25/2014  . Constipation 11/14/2013  . Liver lesion, right lobe 10/11/2013  . Swelling of face 10/10/2013  . Dyslipidemia 10/10/2013  . Palpitations 10/09/2013    Past Surgical History:  Procedure Laterality Date  . ABDOMINAL HYSTERECTOMY    . BREAST SURGERY Bilateral 2000   implants   . OVARIAN CYST REMOVAL    . TUBAL LIGATION  2004     OB History    Gravida  4   Para  4   Term  4   Preterm      AB      Living  4     SAB      TAB      Ectopic      Multiple      Live Births               Home Medications    Prior to Admission medications   Medication Sig Start Date End Date Taking? Authorizing Provider  butalbital-acetaminophen-caffeine (FIORICET, ESGIC) [phone] MG tablet Take 1 tablet by mouth every 6 (six) hours as needed for headache. 09/05/17 09/05/18  Braelen Sproule, Nicanor Alcon, PA-C  cyclobenzaprine (FLEXERIL) 10 MG tablet Take 1 tablet by mouth at bedtime as needed. 04/21/14   [provider]  ferrous sulfate 325 (65 FE) MG tablet Take 1 tablet (325 mg total) by mouth 2 (two) times daily with a meal.  Patient not taking: Reported on 06/30/2015 11/26/14   Boykin Nearing, MD  gabapentin (NEURONTIN) 100 MG capsule Take 1 capsule (100 mg total) by mouth 2 (two) times daily. Patient not taking: Reported on 06/30/2015 02/15/15   Narda Amber K, DO  ibuprofen (ADVIL,MOTRIN) 200 MG tablet Take 200 mg by mouth every 6 (six) hours as needed.    [provider]  omeprazole (PRILOSEC) 20 MG capsule Take 1 capsule (20 mg total) by mouth 2 (two) times daily before a meal. 10/24/16   Veryl Speak, MD  traMADol (ULTRAM) 50 MG tablet Take 1 tablet (50 mg total) by mouth every 6 (six) hours as needed. 10/24/16   Veryl Speak, MD  tranexamic acid (LYSTEDA) 650 MG TABS tablet Take 2 tablets (1,300 mg total) by mouth 3 (three) times daily. Patient not taking: Reported on 06/30/2015 01/12/15   Morene Crocker, CNM    promethazine (PHENERGAN) 25 MG suppository Place 1 suppository (25 mg total) rectally every 6 (six) hours as needed for nausea or vomiting. Patient not taking: Reported on 03/11/2014 03/09/14 03/11/14  Baron Sane, PA-C    Family History Family History  Problem Relation Age of Onset  . Hyperlipidemia Mother   . Hyperlipidemia Maternal Grandmother   . Heart disease Maternal Grandfather   . Heart disease Paternal Grandmother   . Healthy Son         x 3  . Healthy Daughter   . Cancer Neg Hx     Social History Social History   Tobacco Use  . Smoking status: Never Smoker  . Smokeless tobacco: Never Used  Substance Use Topics  . Alcohol use: No    Alcohol/week: 0.0 oz  . Drug use: No     Allergies   Morphine and related   Review of Systems Review of Systems  Constitutional: Negative for appetite change, chills and fever.  HENT: Negative for drooling, ear pain, rhinorrhea, sneezing and sore throat.   Eyes: Positive for photophobia and visual disturbance.  Respiratory: Positive for shortness of breath. Negative for cough, chest tightness and wheezing.   Cardiovascular: Negative for chest pain and palpitations.  Gastrointestinal: Negative for abdominal pain, blood in stool, constipation, diarrhea, nausea and vomiting.  Genitourinary: Negative for dysuria, hematuria and urgency.  Musculoskeletal: Positive for arthralgias and myalgias.  Skin: Negative for rash.  Neurological: Positive for headaches. Negative for dizziness, weakness and light-headedness.     Physical Exam Updated Vital Signs BP 117/77   Pulse (!) 45   Temp 97.9 F (36.6 C) (Oral)   Resp 18   Ht 5\' 7"  (1.702 m)   Wt 78 kg (172 lb)   LMP 05/02/2015 Comment: bilat oophorectomies//a.c.  SpO2 (!) 83%   BMI 26.94 kg/m   Physical Exam  Constitutional: She is oriented to person, place, and time. She appears well-developed and well-nourished. No distress.  HENT:  Head: Normocephalic and  atraumatic.  Nose: Nose normal.  Eyes: Pupils are equal, round, and reactive to light. Conjunctivae and EOM are normal. Right eye exhibits no discharge. Left eye exhibits no discharge. No scleral icterus.  Neck: Normal range of motion. Neck supple.  No meningismus.  Cardiovascular: Normal rate, regular rhythm, normal heart sounds and intact distal pulses. Exam reveals no gallop and no friction rub.  No murmur heard. Pulmonary/Chest: Effort normal and breath sounds normal. No respiratory distress.  Abdominal: Soft. Bowel sounds are normal. She exhibits no distension. There is no tenderness. There is no guarding.  Musculoskeletal: Normal range of  motion. She exhibits no edema.  Equal grip strength bilaterally.  Full active and passive range of motion of bilateral shoulders without difficulty.  Neurological: She is alert and oriented to person, place, and time. No cranial nerve deficit or sensory deficit. She exhibits normal muscle tone. Coordination normal.  Pupils reactive. No facial asymmetry noted. Cranial nerves appear grossly intact. Sensation intact to light touch on face, BUE and BLE. Strength 5/5 in BUE and BLE. Normal finger to nose coordination bilaterally.  Skin: Skin is warm and dry. No rash noted.  Psychiatric: She has a normal mood and affect.  Nursing note and vitals reviewed.    ED Treatments / Results  Labs (all labs ordered are listed, but only abnormal results are displayed) Labs Reviewed  CBC WITH DIFFERENTIAL/PLATELET - Abnormal; Notable for the following components:      Result Value   WBC 10.9 (*)    Eosinophils Absolute 2.1 (*)    All other components within normal limits  BASIC METABOLIC PANEL  TROPONIN I    EKG EKG Interpretation  Date/Time:  Wednesday September 05 2017 17:27:50 EDT Ventricular Rate:  61 PR Interval:    QRS Duration: 93 QT Interval:  419 QTC Calculation: 422 R Axis:   40 Text Interpretation:  Sinus rhythm No STEMI.  Confirmed by Nanda Quinton [phone]) on 09/05/2017 5:30:42 PM   Radiology Dg Chest 2 View  Result Date: 09/05/2017 CLINICAL DATA:  Fatigue and shortness of breath. EXAM: CHEST - 2 VIEW COMPARISON:  Chest x-ray dated June 30, 2015. FINDINGS: The heart size and mediastinal contours are within normal limits. Both lungs are clear. The visualized skeletal structures are unremarkable. IMPRESSION: Normal chest x-ray. Electronically Signed   By: Titus Dubin M.D.   On: 09/05/2017 17:50   Ct Head Wo Contrast  Result Date: 09/05/2017 CLINICAL DATA:  Frontal headache and dizziness for the past 2 weeks. EXAM: CT HEAD WITHOUT CONTRAST TECHNIQUE: Contiguous axial images were obtained from the base of the skull through the vertex without intravenous contrast. COMPARISON:  CT maxillofacial dated February 05, 2016. FINDINGS: Brain: No evidence of acute infarction, hemorrhage, hydrocephalus, extra-axial collection or mass lesion/mass effect. Vascular: No hyperdense vessel or unexpected calcification. Skull: Normal. Negative for fracture or focal lesion. Sinuses/Orbits: No acute finding. Other: None. IMPRESSION: 1.  No acute intracranial abnormality. Electronically Signed   By: Titus Dubin M.D.   On: 09/05/2017 17:54    Procedures Procedures (including critical care time)  Medications Ordered in ED Medications  prochlorperazine (COMPAZINE) injection 10 mg (10 mg Intravenous Given 09/05/17 1810)  diphenhydrAMINE (BENADRYL) injection 12.5 mg (12.5 mg Intravenous Given 09/05/17 1809)  sodium chloride 0.9 % bolus 1,000 mL (0 mLs Intravenous Stopped 09/05/17 1850)     Initial Impression / Assessment and Plan / ED Course  I have reviewed the triage vital signs and the nursing notes.  Pertinent labs & imaging results that were available during my care of the patient were reviewed by me and considered in my medical decision making (see chart for details).     47 year old female with a past medical history of hyperlipidemia,  presents for evaluation of multiple complaints.  Her first complaint is headache for the past week and a half.  Associated with photophobia, phonophobia, intermittent blurry vision and nausea.  History of migraines in the past but this is not getting better with her relief.  Denies any neck pain, numbness in arms or legs, fever, head injuries or falls, fever, neck pain.  She also reports sharp shooting pain in her left shoulder radiating down her left arm.  Pain is worse after a long day of work where she works in an [employer].  Reports associated shortness of breath as well.  Denies any chest pain.  On physical exam she is overall well-appearing.  There are no deficits on her neurological examination.  She is afebrile here.  No meningismus noted.  Equal grip strength bilaterally.  No changes to range of motion of left shoulder.  She is not tachycardic, tachypneic or hypoxic.  Left arm pain does not appear cardiac in nature.  CBC, BMP, troponin unremarkable.  EKG is nonischemic.  Chest x-ray unremarkable.  CT of the head is negative.  Symptoms significantly improved with migraine cocktail and fluids given here.  Suspect that her symptoms are radicular in nature.  Headache is most likely due to migraine. There are no headache characteristics that are lateralizing or concerning for increased ICP, infectious or vascular cause of her symptoms.  I did encourage her to follow-up with her primary care provider and to take Fioricet as needed for headache.  Advised to return to ED for any severe worsening symptoms.  Portions of this note were generated with [employer]. Dictation errors may occur despite best attempts at proofreading.   Final Clinical Impressions(s) / ED Diagnoses   Final diagnoses:  Migraine without aura and without status migrainosus, not intractable    ED Discharge Orders        Ordered    butalbital-acetaminophen-caffeine (FIORICET, ESGIC) 50-325-40 MG tablet  Every 6  hours PRN     09/05/17 1847       Delia Heady, PA-C 09/05/17 1851    Long, W[facility], MD 09/06/17 1349

## 2017-09-05 NOTE — ED Triage Notes (Signed)
C/o HA, fatigue x 1.5 weeks-pain to left arm x 1 month-denies injury to head and arm-NAD-steady gait

## 2018-01-30 ENCOUNTER — Other Ambulatory Visit: Payer: Self-pay | Admitting: Physician Assistant

## 2018-01-30 DIAGNOSIS — Z1231 Encounter for screening mammogram for malignant neoplasm of breast: Secondary | ICD-10-CM

## 2018-06-19 ENCOUNTER — Ambulatory Visit (INDEPENDENT_AMBULATORY_CARE_PROVIDER_SITE_OTHER): Payer: BLUE CROSS/BLUE SHIELD | Admitting: Plastic Surgery

## 2018-06-19 ENCOUNTER — Encounter: Payer: Self-pay | Admitting: Plastic Surgery

## 2018-06-19 ENCOUNTER — Other Ambulatory Visit: Payer: Self-pay

## 2018-06-19 VITALS — BP 117/71 | HR 64 | Temp 98.4°F | Ht 67.0 in | Wt 177.0 lb

## 2018-06-19 DIAGNOSIS — R220 Localized swelling, mass and lump, head: Secondary | ICD-10-CM | POA: Diagnosis not present

## 2018-06-27 ENCOUNTER — Encounter: Payer: Self-pay | Admitting: Plastic Surgery

## 2018-06-27 NOTE — Progress Notes (Signed)
Patient ID: Alexandria Herrera, female    DOB: 11-25-1970, 48 y.o.   MRN: [id number]   Chief Complaint  Patient presents with  . Skin Problem    The patient is a 48 year old female here for evaluation of her face.  Several years ago, she went to visit her family in [place], [place].  While she was there she underwent filler injection in her midface and nasolabial folds.  Proximately 8 months ago she started developing firmness in her face and bumps where the filler had been injected.  She took another trip to the area to find out what could be done about the firmness.  She was not able to find the injector or what had been injected.  She is not aware of the substance or material that had been used.  The areas that were injected are firm and tender to palpation.  Sometimes they are more tender then others.  Nothing seems to make it better.  She denies any other symptoms.     Review of Systems  Constitutional: Negative.  Negative for activity change and appetite change.  HENT: Negative for trouble swallowing.   Eyes: Negative.  Negative for pain.  Respiratory: Negative.  Negative for chest tightness and shortness of breath.   Cardiovascular: Negative.   Gastrointestinal: Negative.   Genitourinary: Negative.   Musculoskeletal: Negative.   Skin: Positive for color change and wound.  Hematological: Negative.   Psychiatric/Behavioral: Negative.     Past Medical History:  Diagnosis Date  . Hyperlipemia 1996  . Liver cyst   . Ovarian cyst 1998, 2001   s/p  cyst removal   . S/P Botox injection 2012   face     Past Surgical History:  Procedure Laterality Date  . ABDOMINAL HYSTERECTOMY    . BREAST SURGERY Bilateral 2000   implants   . OVARIAN CYST REMOVAL    . TUBAL LIGATION  2004      Current Outpatient Medications:  .  atorvastatin (LIPITOR) 10 MG tablet, Take 10 mg by mouth daily., Disp: , Rfl:  .  butalbital-acetaminophen-caffeine (FIORICET, ESGIC) 50-325-40 MG tablet,  Take 1 tablet by mouth every 6 (six) hours as needed for headache. (Patient not taking: Reported on 06/19/2018), Disp: 20 tablet, Rfl: 0 .  cyclobenzaprine (FLEXERIL) 10 MG tablet, Take 1 tablet by mouth at bedtime as needed., Disp: , Rfl:  .  ferrous sulfate 325 (65 FE) MG tablet, Take 1 tablet (325 mg total) by mouth 2 (two) times daily with a meal. (Patient not taking: Reported on 06/30/2015), Disp: 60 tablet, Rfl: 3 .  gabapentin (NEURONTIN) 100 MG capsule, Take 1 capsule (100 mg total) by mouth 2 (two) times daily. (Patient not taking: Reported on 06/30/2015), Disp: 120 capsule, Rfl: 5 .  ibuprofen (ADVIL,MOTRIN) 200 MG tablet, Take 200 mg by mouth every 6 (six) hours as needed., Disp: , Rfl:  .  omeprazole (PRILOSEC) 20 MG capsule, Take 1 capsule (20 mg total) by mouth 2 (two) times daily before a meal. (Patient not taking: Reported on 06/19/2018), Disp: 30 capsule, Rfl: 0 .  traMADol (ULTRAM) 50 MG tablet, Take 1 tablet (50 mg total) by mouth every 6 (six) hours as needed. (Patient not taking: Reported on 06/19/2018), Disp: 15 tablet, Rfl: 0 .  tranexamic acid (LYSTEDA) 650 MG TABS tablet, Take 2 tablets (1,300 mg total) by mouth 3 (three) times daily. (Patient not taking: Reported on 06/30/2015), Disp: 30 tablet, Rfl: 3   Objective:   Vitals:  06/19/18 1338  BP: 117/71  Pulse: 64  Temp: 98.4 F (36.9 C)  SpO2: 99%    Physical Exam Vitals signs and nursing note reviewed.  Constitutional:      Appearance: Normal appearance.  HENT:     Head: Normocephalic.     Nose: Nose normal.     Mouth/Throat:     Mouth: Mucous membranes are moist.  Cardiovascular:     Rate and Rhythm: Normal rate.  Pulmonary:     Effort: Pulmonary effort is normal.  Neurological:     General: No focal deficit present.     Mental Status: She is alert.  Psychiatric:        Mood and Affect: Mood normal.        Behavior: Behavior normal.     Assessment & Plan:  Swelling of face  Warm Compresses to the face  twice a day.  Follow up in two months.  [place], DO

## 2018-07-10 ENCOUNTER — Other Ambulatory Visit: Payer: Self-pay

## 2018-07-10 ENCOUNTER — Encounter: Payer: Self-pay | Admitting: Plastic Surgery

## 2018-07-10 ENCOUNTER — Ambulatory Visit (INDEPENDENT_AMBULATORY_CARE_PROVIDER_SITE_OTHER): Payer: BLUE CROSS/BLUE SHIELD | Admitting: Plastic Surgery

## 2018-07-10 VITALS — BP 119/76 | HR 88 | Temp 98.4°F | Ht 67.0 in | Wt 176.2 lb

## 2018-07-10 DIAGNOSIS — R220 Localized swelling, mass and lump, head: Secondary | ICD-10-CM

## 2018-07-10 NOTE — Progress Notes (Signed)
   Subjective:    Patient ID: Alexandria Herrera, female    DOB: April 30, 1970, 48 y.o.   MRN: [id number]  Patient is here for a follow-up on her face.  She went to [place] and had filler placed.  She is not sure what kind of filler was placed and has not been able to find out anything the only thing she is aware of is a biopolymer.  The area has gotten a little bit better over the past 2 weeks.  It does not appear infected.  She is on prednisone.  She denies any fevers.   Review of Systems  Constitutional: Negative.  Negative for activity change and appetite change.  HENT: Negative.   Eyes: Negative.   Respiratory: Negative.   Cardiovascular: Negative.   Gastrointestinal: Negative.   Genitourinary: Negative.   Musculoskeletal: Negative.   Hematological: Negative.   Psychiatric/Behavioral: Negative.        Objective:   Physical Exam HENT:     Head: Normocephalic.  Cardiovascular:     Rate and Rhythm: Normal rate.  Neurological:     General: No focal deficit present.     Mental Status: She is alert and oriented to person, place, and time.  Psychiatric:        Mood and Affect: Mood normal.        Behavior: Behavior normal.        Assessment & Plan:  Swelling of face  Have recommended warm compresses twice a day with anti-inflammatory medication over-the-counter.  I have recommended no surgical intervention from my part.  She is most welcome for a second opinion.  I have severe concerns about trying to excise something that I do not know what it is.  Patient was given the name of another plastic surgeon in town and is welcome to seek further opinion as this is a difficult situation. Picture taken with patient consent and placed in chart.

## 2018-07-11 ENCOUNTER — Ambulatory Visit: Payer: Self-pay | Admitting: Plastic Surgery

## 2018-07-16 ENCOUNTER — Other Ambulatory Visit: Payer: Self-pay | Admitting: Physician Assistant

## 2018-07-16 DIAGNOSIS — N6320 Unspecified lump in the left breast, unspecified quadrant: Secondary | ICD-10-CM

## 2018-07-26 ENCOUNTER — Other Ambulatory Visit: Payer: Self-pay

## 2018-08-26 ENCOUNTER — Encounter (HOSPITAL_BASED_OUTPATIENT_CLINIC_OR_DEPARTMENT_OTHER): Payer: Self-pay | Admitting: *Deleted

## 2018-08-26 ENCOUNTER — Emergency Department (HOSPITAL_BASED_OUTPATIENT_CLINIC_OR_DEPARTMENT_OTHER)
Admission: EM | Admit: 2018-08-26 | Discharge: 2018-08-26 | Disposition: A | Discharge status: Home / Self Care | Payer: BLUE CROSS/BLUE SHIELD | Attending: Emergency Medicine | Admitting: Emergency Medicine

## 2018-08-26 ENCOUNTER — Emergency Department (HOSPITAL_BASED_OUTPATIENT_CLINIC_OR_DEPARTMENT_OTHER): Payer: BLUE CROSS/BLUE SHIELD

## 2018-08-26 ENCOUNTER — Other Ambulatory Visit: Payer: Self-pay

## 2018-08-26 DIAGNOSIS — R1031 Right lower quadrant pain: Secondary | ICD-10-CM | POA: Diagnosis present

## 2018-08-26 DIAGNOSIS — Z79899 Other long term (current) drug therapy: Secondary | ICD-10-CM | POA: Diagnosis not present

## 2018-08-26 DIAGNOSIS — K5900 Constipation, unspecified: Secondary | ICD-10-CM

## 2018-08-26 DIAGNOSIS — Z9889 Other specified postprocedural states: Secondary | ICD-10-CM | POA: Insufficient documentation

## 2018-08-26 DIAGNOSIS — Y929 Unspecified place or not applicable: Secondary | ICD-10-CM | POA: Diagnosis not present

## 2018-08-26 DIAGNOSIS — Y939 Activity, unspecified: Secondary | ICD-10-CM | POA: Insufficient documentation

## 2018-08-26 DIAGNOSIS — S301XXA Contusion of abdominal wall, initial encounter: Secondary | ICD-10-CM | POA: Insufficient documentation

## 2018-08-26 DIAGNOSIS — X58XXXA Exposure to other specified factors, initial encounter: Secondary | ICD-10-CM | POA: Insufficient documentation

## 2018-08-26 DIAGNOSIS — Y999 Unspecified external cause status: Secondary | ICD-10-CM | POA: Diagnosis not present

## 2018-08-26 LAB — URINALYSIS, ROUTINE W REFLEX MICROSCOPIC
Bilirubin Urine: NEGATIVE
Glucose, UA: NEGATIVE mg/dL
Ketones, ur: NEGATIVE mg/dL
Leukocytes,Ua: NEGATIVE
Nitrite: NEGATIVE
Protein, ur: NEGATIVE mg/dL
Specific Gravity, Urine: 1.015 (ref 1.005–1.030)
pH: 7.5 (ref 5.0–8.0)

## 2018-08-26 LAB — COMPREHENSIVE METABOLIC PANEL
ALT: 21 U/L (ref 0–44)
AST: 22 U/L (ref 15–41)
Albumin: 4 g/dL (ref 3.5–5.0)
Alkaline Phosphatase: 79 U/L (ref 38–126)
Anion gap: 9 (ref 5–15)
BUN: 13 mg/dL (ref 6–20)
CO2: 25 mmol/L (ref 22–32)
Calcium: 9.2 mg/dL (ref 8.9–10.3)
Chloride: 104 mmol/L (ref 98–111)
Creatinine, Ser: 0.55 mg/dL (ref 0.44–1.00)
GFR calc Af Amer: >60 mL/min (ref >60)
GFR calc non Af Amer: >60 mL/min (ref >60)
Glucose, Bld: 95 mg/dL (ref 70–99)
Potassium: 3.4 mmol/L — ABNORMAL LOW (ref 3.5–5.1)
Sodium: 138 mmol/L (ref 135–145)
Total Bilirubin: 0.3 mg/dL (ref 0.3–1.2)
Total Protein: 7.3 g/dL (ref 6.5–8.1)

## 2018-08-26 LAB — CBC WITH DIFFERENTIAL/PLATELET
Abs Immature Granulocytes: 0.03 10*3/uL (ref 0.00–0.07)
Basophils Absolute: 0.1 10*3/uL (ref 0.0–0.1)
Basophils Relative: 1 %
Eosinophils Absolute: 1.3 10*3/uL — ABNORMAL HIGH (ref 0.0–0.5)
Eosinophils Relative: 14 %
HCT: 40.5 % (ref 36.0–46.0)
Hemoglobin: 13 g/dL (ref 12.0–15.0)
Immature Granulocytes: 0 %
Lymphocytes Relative: 28 %
Lymphs Abs: 2.6 10*3/uL (ref 0.7–4.0)
MCH: 29.1 pg (ref 26.0–34.0)
MCHC: 32.1 g/dL (ref 30.0–36.0)
MCV: 90.8 fL (ref 80.0–100.0)
Monocytes Absolute: 0.4 10*3/uL (ref 0.1–1.0)
Monocytes Relative: 5 %
Neutro Abs: 4.8 10*3/uL (ref 1.7–7.7)
Neutrophils Relative %: 52 %
Platelets: 269 10*3/uL (ref 150–400)
RBC: 4.46 MIL/uL (ref 3.87–5.11)
RDW: 12.6 % (ref 11.5–15.5)
WBC: 9.2 10*3/uL (ref 4.0–10.5)
nRBC: 0 % (ref 0.0–0.2)

## 2018-08-26 LAB — URINALYSIS, MICROSCOPIC (REFLEX)

## 2018-08-26 LAB — LIPASE, BLOOD: Lipase: 41 U/L (ref 11–51)

## 2018-08-26 MED ORDER — IOHEXOL 300 MG/ML  SOLN
100.0000 mL | Freq: Once | INTRAMUSCULAR | Status: AC | PRN
  Administered 2018-08-26: 100 mL via INTRAVENOUS

## 2018-08-26 MED ORDER — POLYETHYLENE GLYCOL 3350 17 G PO PACK: 17.0000 g | PACK | Freq: Every day | ORAL | 0 refills | Status: AC

## 2018-08-26 MED ORDER — DICYCLOMINE HCL 20 MG PO TABS: 20.0000 mg | ORAL_TABLET | Freq: Two times a day (BID) | ORAL | 0 refills | Status: AC | PRN

## 2018-08-26 MED ORDER — IOHEXOL 300 MG/ML  SOLN: 25.0000 mL | Freq: Once | INTRAMUSCULAR | Status: DC | PRN

## 2018-08-26 NOTE — ED Notes (Signed)
Patient transported to Ultrasound 

## 2018-08-26 NOTE — ED Triage Notes (Signed)
C/o rt lower abd pain x 5 days  Denies ua sx,  No vag dc  States diff w bm  Last bm 2 days ago  States abd feels bloated,  Has bitter taste in mouth when eating

## 2018-08-26 NOTE — ED Provider Notes (Signed)
[facility] EMERGENCY DEPARTMENT Provider Note   CSN: [id number] Arrival date & time: 08/26/18  1002    History   Chief Complaint Chief Complaint  Patient presents with  . Abdominal Pain    HPI Alexandria Herrera is a 48 y.o. female.     HPI Patient states she has had 5 days of abdominal pain.  States the pain initially started in the central region and now has localized to the right lower quadrant.  She denies any fever or chills.  She endorses constipation for the last 2 days.  She has no nausea or vomiting.  No urinary or vaginal symptoms. Past Medical History:  Diagnosis Date  . Hyperlipemia 1996  . Liver cyst   . Ovarian cyst 1998, 2001   s/p  cyst removal   . S/P Botox injection 2012   face     Patient Active Problem List   Diagnosis Date Noted  . Meralgia paresthetica 02/15/2015  . BV (bacterial vaginosis) 11/26/2014  . 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 Uterine fibroid 11/25/2014  . Pap smear for cervical cancer screening 11/25/2014  . Anemia 11/25/2014  . GERD (gastroesophageal reflux disease) 11/25/2014  . Constipation 11/14/2013  . Liver lesion, right lobe 10/11/2013  . Swelling of face 10/10/2013  . Dyslipidemia 10/10/2013  . Palpitations 10/09/2013    Past Surgical History:  Procedure Laterality Date  . ABDOMINAL HYSTERECTOMY    . BREAST SURGERY Bilateral 2000   implants   . OVARIAN CYST REMOVAL    . TUBAL LIGATION  2004     OB History    Gravida  4   Para  4   Term  4   Preterm      AB      Living  4     SAB      TAB      Ectopic      Multiple      Live Births               Home Medications    Prior to Admission medications   Medication Sig Start Date End Date Taking? Authorizing Provider  cyclobenzaprine (FLEXERIL) 10 MG tablet Take 1 tablet by mouth at bedtime as needed. 04/21/14   [provider]  dicyclomine (BENTYL) 20 MG tablet Take 1 tablet (20 mg total) by mouth 2 (two)  times daily as needed for spasms. 08/26/18   Julianne Rice, MD  fluticasone (FLONASE) 50 MCG/ACT nasal spray USE 1 SQUIRT TO EACH NOSTRIL DAILY AS NEEDED FOR SINUS SYMPTOMS 07/02/18   [provider]  gabapentin (NEURONTIN) 100 MG capsule Take 1 capsule (100 mg total) by mouth 2 (two) times daily. 02/15/15   Patel, Arvin Collard K, DO  ibuprofen (ADVIL,MOTRIN) 200 MG tablet Take 200 mg by mouth every 6 (six) hours as needed.    [provider]  polyethylene glycol (MIRALAX / GLYCOLAX) 17 g packet Take 17 g by mouth daily. 08/26/18   Julianne Rice, MD  rosuvastatin (CRESTOR) 10 MG tablet TAKE ONE TABLET (10 MG DOSE) BY MOUTH AT BEDTIME. 04/28/18   [provider]    Family History Family History  Problem Relation Age of Onset  . Hyperlipidemia Mother   . Hyperlipidemia Maternal Grandmother   . Heart disease Maternal Grandfather   . Heart disease Paternal Grandmother   . Healthy Son         x 3  . Healthy Daughter   . Cancer Neg Hx  Social History Social History   Tobacco Use  . Smoking status: Never Smoker  . Smokeless tobacco: Never Used  Substance Use Topics  . Alcohol use: No    Alcohol/week: 0.0 standard drinks  . Drug use: No     Allergies   Morphine and related   Review of Systems Review of Systems  Constitutional: Negative for chills and fever.  Respiratory: Negative for cough and wheezing.   Cardiovascular: Negative for chest pain and palpitations.  Gastrointestinal: Positive for abdominal pain and constipation. Negative for diarrhea, nausea and vomiting.  Genitourinary: Negative for flank pain, frequency, hematuria, pelvic pain and vaginal bleeding.  Musculoskeletal: Negative for back pain, myalgias and neck pain.  Skin: Negative for rash and wound.  Neurological: Negative for dizziness, weakness, light-headedness and numbness.  All other systems reviewed and are negative.    Physical Exam Updated Vital Signs BP 136/78 (BP Location:  Right Arm)   Pulse 75   Temp 98.3 F (36.8 C) (Oral)   Resp 12   Ht 5\' 6"  (1.676 m)   Wt 68.9 kg   LMP 05/02/2015 Comment: bilat oophorectomies//a.c.  SpO2 100%   BMI 24.53 kg/m   Physical Exam Vitals signs and nursing note reviewed.  Constitutional:      Appearance: Normal appearance. She is well-developed.  HENT:     Head: Normocephalic and atraumatic.  Eyes:     Pupils: Pupils are equal, round, and reactive to light.  Neck:     Musculoskeletal: Normal range of motion and neck supple.  Cardiovascular:     Rate and Rhythm: Normal rate and regular rhythm.  Pulmonary:     Effort: Pulmonary effort is normal.     Breath sounds: Normal breath sounds.  Abdominal:     General: Bowel sounds are normal.     Palpations: Abdomen is soft.     Tenderness: There is abdominal tenderness. There is no guarding or rebound.     Comments: Abdomen is mildly distended.  Patient also has mild diffuse tenderness to palpation which is most pronounced in the right lower quadrant.  There is no rebound or guarding.  Musculoskeletal: Normal range of motion.        General: No swelling, tenderness, deformity or signs of injury.     Right lower leg: No edema.     Left lower leg: No edema.     Comments: No CVA tenderness bilaterally.  Skin:    General: Skin is warm and dry.     Capillary Refill: Capillary refill takes less than 2 seconds.     Findings: No erythema or rash.  Neurological:     General: No focal deficit present.     Mental Status: She is alert and oriented to person, place, and time.  Psychiatric:        Behavior: Behavior normal.      ED Treatments / Results  Labs (all labs ordered are listed, but only abnormal results are displayed) Labs Reviewed  CBC WITH DIFFERENTIAL/PLATELET - Abnormal; Notable for the following components:      Result Value   Eosinophils Absolute 1.3 (*)    All other components within normal limits  COMPREHENSIVE METABOLIC PANEL - Abnormal; Notable for  the following components:   Potassium 3.4 (*)    All other components within normal limits  URINALYSIS, ROUTINE W REFLEX MICROSCOPIC - Abnormal; Notable for the following components:   Hgb urine dipstick SMALL (*)    All other components within normal limits  URINALYSIS,  MICROSCOPIC (REFLEX) - Abnormal; Notable for the following components:   Bacteria, UA FEW (*)    All other components within normal limits  LIPASE, BLOOD    EKG None  Radiology Ct Abdomen Pelvis W Contrast  Result Date: 08/26/2018 CLINICAL DATA:  Right lower abdominal pain for 5 days, abdominal bloating EXAM: CT ABDOMEN AND PELVIS WITH CONTRAST TECHNIQUE: Multidetector CT imaging of the abdomen and pelvis was performed using the standard protocol following bolus administration of intravenous contrast. CONTRAST:  168mL OMNIPAQUE IOHEXOL 300 MG/ML SOLN, additional oral enteric contrast COMPARISON:  11/17/2014 FINDINGS: Lower chest: No acute abnormality. Hepatobiliary: No focal liver abnormality is seen. No gallstones, gallbladder wall thickening, or biliary dilatation. Pancreas: Unremarkable. No pancreatic ductal dilatation or surrounding inflammatory changes. Spleen: Normal in size without focal abnormality. Adrenals/Urinary Tract: Adrenal glands are unremarkable. Kidneys are normal, without renal calculi, focal lesion, or hydronephrosis. Bladder is unremarkable. Stomach/Bowel: Stomach is within normal limits. Appendix appears normal. No evidence of bowel wall thickening, distention, or inflammatory changes. Colonic diverticulosis. Moderate burden of stool in the colon. Vascular/Lymphatic: No significant vascular findings are present. No enlarged abdominal or pelvic lymph nodes. Reproductive: Status post hysterectomy. Other: Postoperative findings about the abdomen, including a large, elongated fluid collection of the ventral abdomen, overlying the rectus musculature, measuring at least 19.1 x 13.2 x 1.9 cm (series 2, image 66,  series 5, image 58). Musculoskeletal: No acute or significant osseous findings. IMPRESSION: 1. No acute internal findings of the abdomen or pelvis to explain pain or bloating. 2. Postoperative findings about the abdomen, including a large, elongated fluid collection of the ventral abdomen, overlying the rectus musculature, measuring at least 19.1 x 13.2 x 1.9 cm (series 2, image 66, series 5, image 58). This may reflect a postoperative hematoma or seroma from abdominoplasty. Correlate with surgical history. The presence or absence of infection is not evaluated. 3.  Other chronic and incidental findings as detailed above. Electronically Signed   By: Eddie Candle M.D.   On: 08/26/2018 11:14    Procedures Procedures (including critical care time)  Medications Ordered in ED Medications  iohexol (OMNIPAQUE) 300 MG/ML solution 25 mL (has no administration in time range)  iohexol (OMNIPAQUE) 300 MG/ML solution 100 mL (100 mLs Intravenous Contrast Given 08/26/18 1047)     Initial Impression / Assessment and Plan / ED Course  I have reviewed the triage vital signs and the nursing notes.  Pertinent labs & imaging results that were available during my care of the patient were reviewed by me and considered in my medical decision making (see chart for details).        CT abdomen and pelvis with moderate constipation.  Patient also has abdominal wall seroma likely from recent hysterectomy.  Low suspicion that this is secondarily infected.  Normal white blood cell count.  Advised to follow-up with her surgeon regarding this.  Will start on MiraLAX and give Bentyl.  Return precautions given.  Final Clinical Impressions(s) / ED Diagnoses   Final diagnoses:  Abdominal wall seroma, initial encounter  Constipation, unspecified constipation type    ED Discharge Orders         Ordered    dicyclomine (BENTYL) 20 MG tablet  2 times daily PRN     08/26/18 1215    polyethylene glycol (MIRALAX / GLYCOLAX) 17 g  packet  Daily     08/26/18 1215           Julianne Rice, MD 08/26/18 1216

## 2018-10-01 ENCOUNTER — Institutional Professional Consult (permissible substitution): Payer: BLUE CROSS/BLUE SHIELD | Admitting: Plastic Surgery

## 2020-12-05 IMAGING — CT CT ABDOMEN AND PELVIS WITH CONTRAST
2 of 5 series · 16 of 46 positions shown, 18 images · IV contrast (APPLIED)
Comparison: 11/17/2014

CLINICAL DATA: Right lower abdominal pain for 5 days, abdominal
bloating

EXAM:
CT ABDOMEN AND PELVIS WITH CONTRAST
TECHNIQUE: Multidetector CT imaging of the abdomen and pelvis was performed
using the standard protocol following bolus administration of
intravenous contrast.
CONTRAST:  100mL OMNIPAQUE IOHEXOL 300 MG/ML SOLN, additional oral
enteric contrast

[Series 2: axial st · axial · 0.76mm/px · z∈[-638,-178]mm · 13 of 104 slices shown, 15 images]
[im 6/104  soft-tissue]
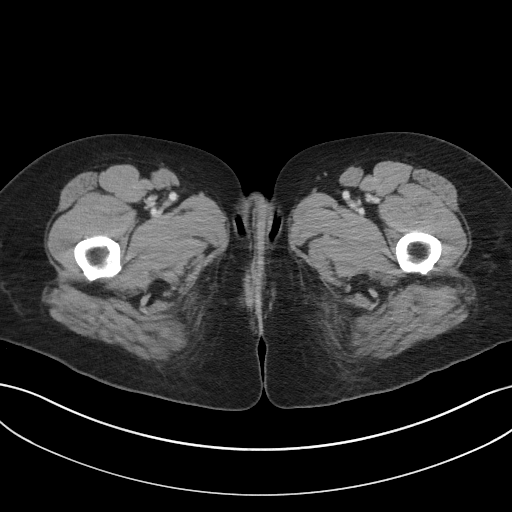
[im 6/104  bone]
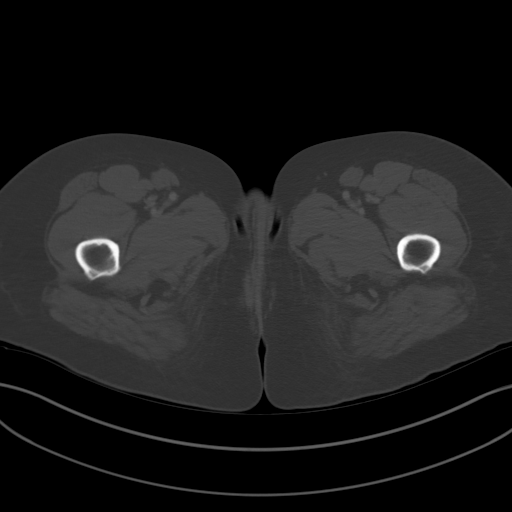
[im 16/104  soft-tissue]
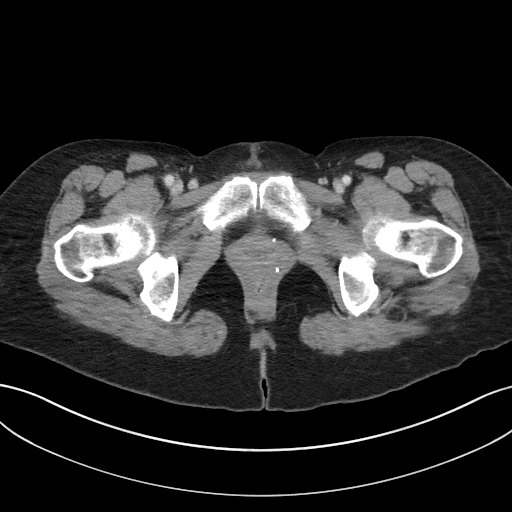
[im 21/104  soft-tissue]
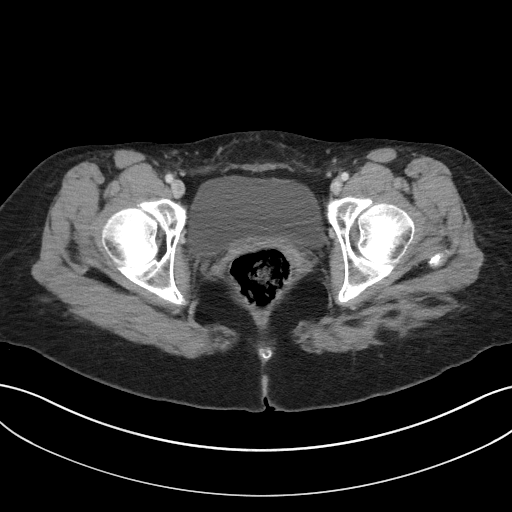
[im 31/104  soft-tissue]
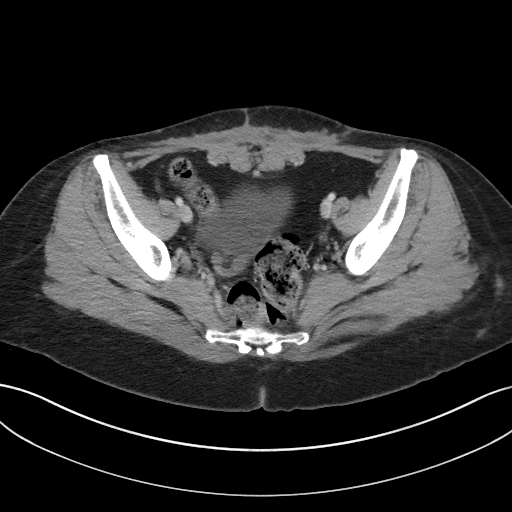
[im 37/104  soft-tissue]
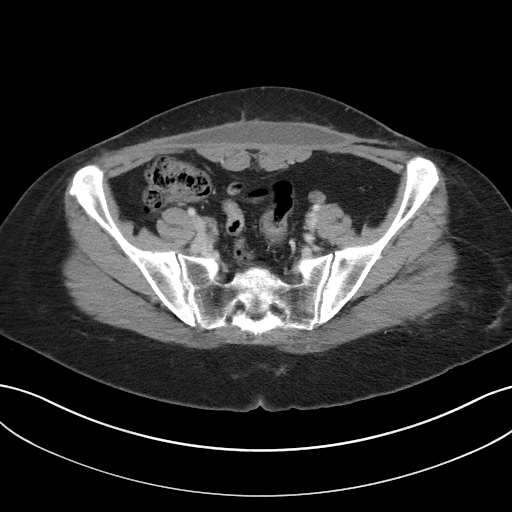
[im 47/104  soft-tissue]
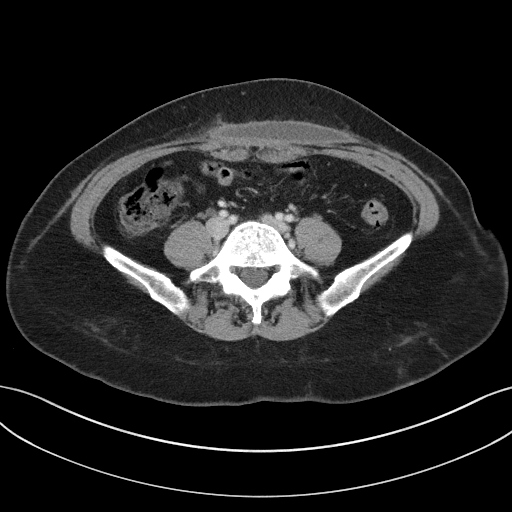
[im 52/104  soft-tissue]
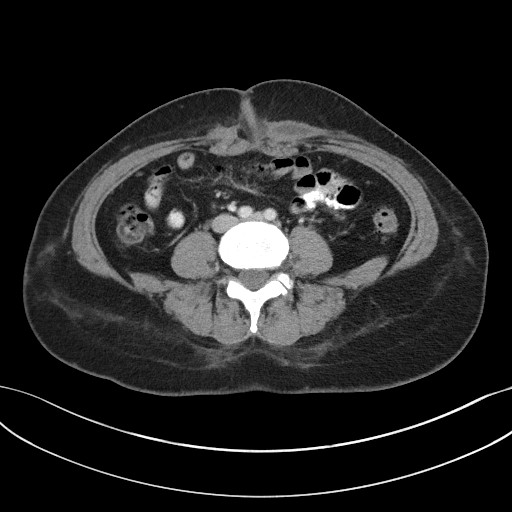
[im 57/104  soft-tissue]
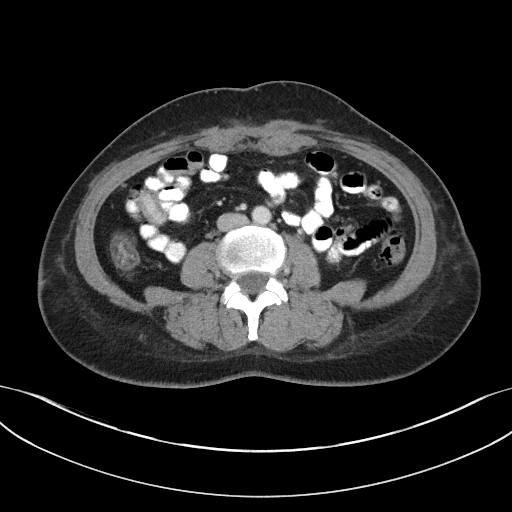
[im 67/104  soft-tissue]
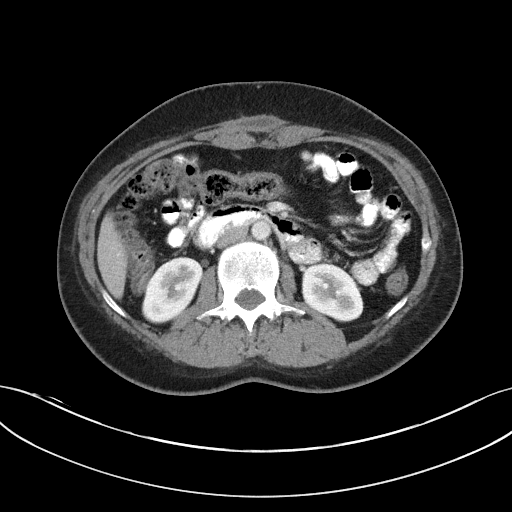
[im 67/104  bone]
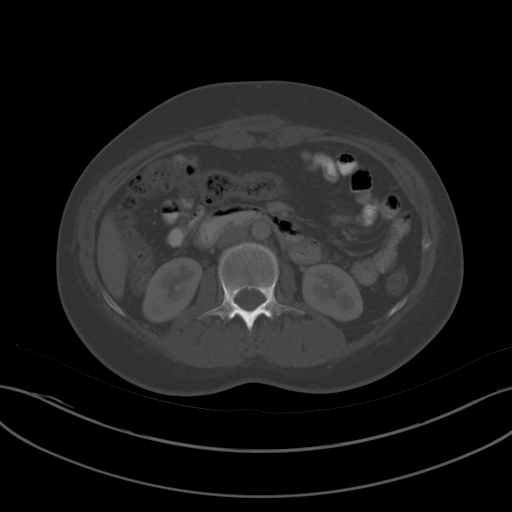
[im 73/104  soft-tissue]
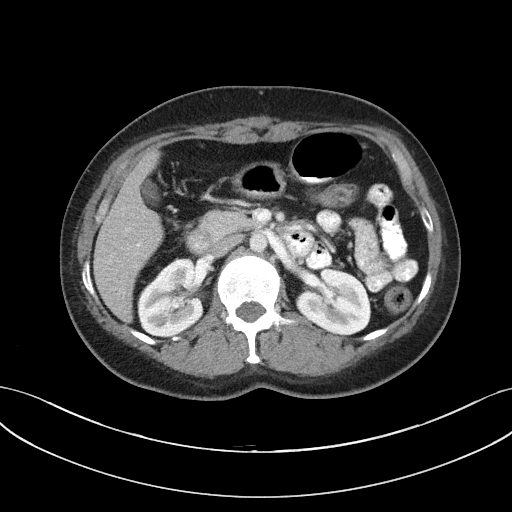
[im 83/104  soft-tissue]
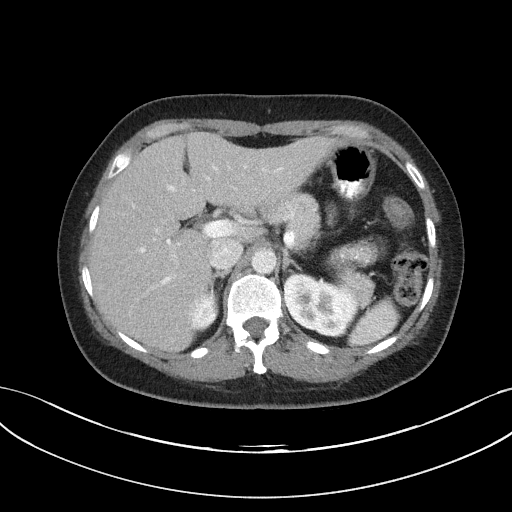
[im 88/104  soft-tissue]
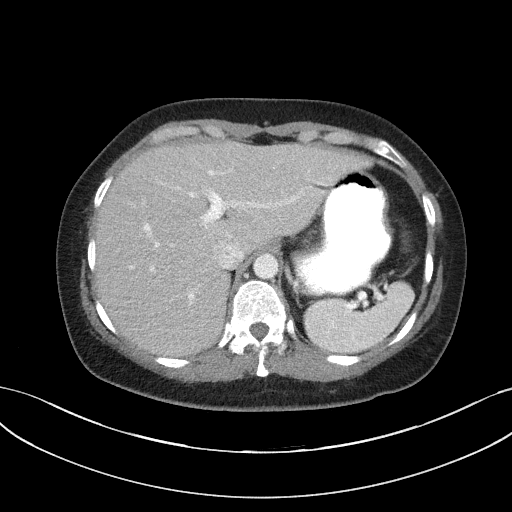
[im 98/104  soft-tissue]
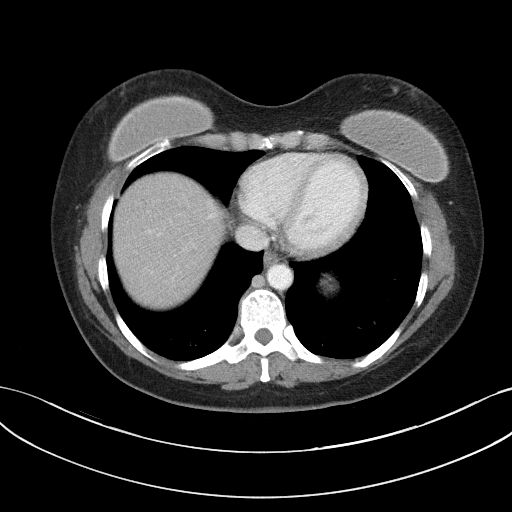

[Series 4: coronal st · coronal · 0.92mm/px · 3 of 80 slices shown]
[im 27/80  soft-tissue]
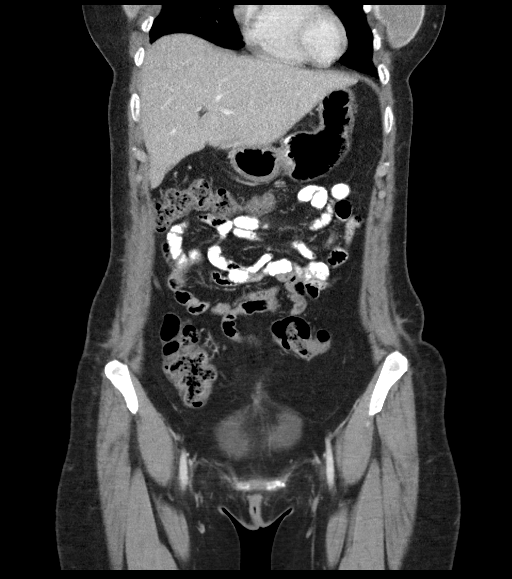
[im 36/80  soft-tissue]
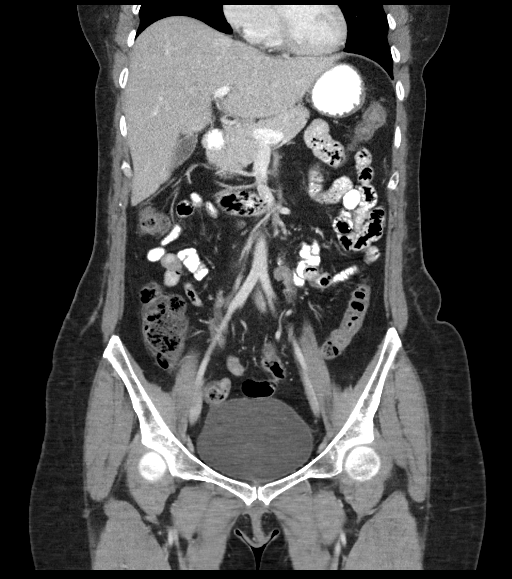
[im 44/80  soft-tissue]
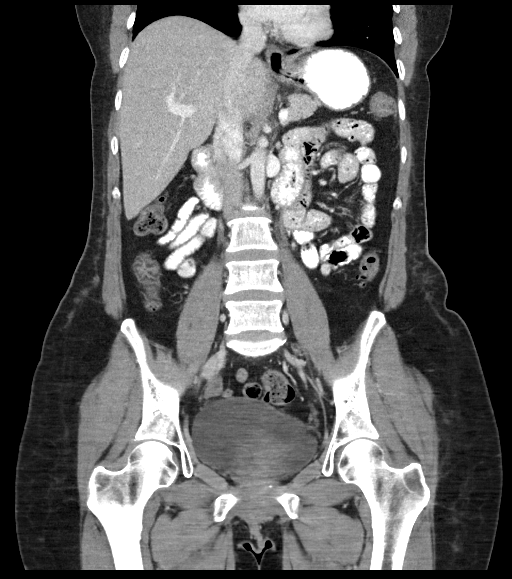

[16 of 46 positions shown; findings below may reference images not displayed]

FINDINGS: Lower chest: No acute abnormality.

Hepatobiliary: No focal liver abnormality is seen. No gallstones,
gallbladder wall thickening, or biliary dilatation.

Pancreas: Unremarkable. No pancreatic ductal dilatation or
surrounding inflammatory changes.

Spleen: Normal in size without focal abnormality.

Adrenals/Urinary Tract: Adrenal glands are unremarkable. Kidneys are
normal, without renal calculi, focal lesion, or hydronephrosis.
Bladder is unremarkable.

Stomach/Bowel: Stomach is within normal limits. Appendix appears
normal. No evidence of bowel wall thickening, distention, or
inflammatory changes. Colonic diverticulosis. Moderate burden of
stool in the colon.

Vascular/Lymphatic: No significant vascular findings are present. No
enlarged abdominal or pelvic lymph nodes.

Reproductive: Status post hysterectomy.

Other: Postoperative findings about the abdomen, including a large,
elongated fluid collection of the ventral abdomen, overlying the
rectus musculature, measuring at least 19.1 x 13.2 x 1.9 cm (series
2, image 66, series 5, image 58).

Musculoskeletal: No acute or significant osseous findings.
IMPRESSION: 1. No acute internal findings of the abdomen or pelvis to explain
pain or bloating.

2. Postoperative findings about the abdomen, including a large,
elongated fluid collection of the ventral abdomen, overlying the
rectus musculature, measuring at least 19.1 x 13.2 x 1.9 cm (series
2, image 66, series 5, image 58). This may reflect a postoperative
hematoma or seroma from abdominoplasty. Correlate with surgical
history. The presence or absence of infection is not evaluated.

3.  Other chronic and incidental findings as detailed above.

## 2023-02-23 ENCOUNTER — Other Ambulatory Visit: Payer: Self-pay

## 2023-02-23 ENCOUNTER — Emergency Department (HOSPITAL_BASED_OUTPATIENT_CLINIC_OR_DEPARTMENT_OTHER)
Admission: EM | Admit: 2023-02-23 | Discharge: 2023-02-23 | Disposition: A | Discharge status: Home / Self Care | Payer: BC Managed Care – PPO | Attending: Emergency Medicine | Admitting: Emergency Medicine

## 2023-02-23 ENCOUNTER — Encounter (HOSPITAL_BASED_OUTPATIENT_CLINIC_OR_DEPARTMENT_OTHER): Payer: Self-pay | Admitting: Emergency Medicine

## 2023-02-23 DIAGNOSIS — T7840XA Allergy, unspecified, initial encounter: Secondary | ICD-10-CM | POA: Insufficient documentation

## 2023-02-23 MED ORDER — LORATADINE 10 MG PO TABS
10.0000 mg | ORAL_TABLET | Freq: Once | ORAL | Status: AC
  Administered 2023-02-23: 10 mg via ORAL
  Filled 2023-02-23: qty 1

## 2023-02-23 MED ORDER — METHYLPREDNISOLONE ACETATE 80 MG/ML IJ SUSP
80.0000 mg | Freq: Once | INTRAMUSCULAR | Status: AC
  Administered 2023-02-23: 80 mg via INTRAMUSCULAR
  Filled 2023-02-23: qty 1

## 2023-02-23 MED ORDER — FAMOTIDINE 20 MG PO TABS
40.0000 mg | ORAL_TABLET | Freq: Once | ORAL | Status: AC
  Administered 2023-02-23: 40 mg via ORAL
  Filled 2023-02-23: qty 2

## 2023-02-23 MED ORDER — PREDNISONE 10 MG PO TABS: 40.0000 mg | ORAL_TABLET | Freq: Every day | ORAL | 0 refills | Status: AC

## 2023-02-23 NOTE — ED Triage Notes (Signed)
Pt POV reports new facial swelling, facial itching since 2300 yesterday.  Pt took 1 benadryl appx 0400 today, no relief.   Pt tolerating secretions, respirations equal, unlabored.

## 2023-02-23 NOTE — ED Notes (Signed)
States has had swelling to face since this am. Denies difficulty swallowing or breathing .

## 2023-02-23 NOTE — Discharge Instructions (Signed)
You were seen in the ER today for a suspected allergic reaction. I am unsure what caused your reaction, but you were given Depo-Medrol, Claritin, and Pepcid which improved your symptoms. Please take the short course of steroids for the next few days as prescribed. Return to the ER if symptoms are worsening. Otherwise follow up with your primary care provider.

## 2023-02-23 NOTE — ED Provider Notes (Signed)
[facility]PART[facility] AT [facility] Provider Note   CSN: [id number] Arrival date & time: 02/23/23  1602     History Chief Complaint  Patient presents with   Allergic Reaction    Alexandria Herrera is a 52 y.o. female.  Patient presents the emergency department concerns of an allergic reaction.  She reports that she has had some facial swelling and itching that started at about 11 PM last night.  Reports that she took Benadryl around 4 AM this morning without improvement in symptoms.  Denies any chest tightness, difficulty swallowing, or shortness of breath.  Has previously had allergic reactions although she is not sure of any specific foods that may have triggered this response.  No history of anaphylaxis.  Does not appear to be in any acute distress at this time.   Allergic Reaction      Home Medications Prior to Admission medications   Medication Sig Start Date End Date Taking? Authorizing Provider  predniSONE (DELTASONE) 10 MG tablet Take 4 tablets (40 mg total) by mouth daily for 5 days. 02/23/23 02/28/23 Yes Smitty Knudsen, PA-C  cyclobenzaprine (FLEXERIL) 10 MG tablet Take 1 tablet by mouth at bedtime as needed. 04/21/14   [provider]  dicyclomine (BENTYL) 20 MG tablet Take 1 tablet (20 mg total) by mouth 2 (two) times daily as needed for spasms. 08/26/18   Loren Racer, MD  fluticasone (FLONASE) 50 MCG/ACT nasal spray USE 1 SQUIRT TO EACH NOSTRIL DAILY AS NEEDED FOR SINUS SYMPTOMS 07/02/18   [provider]  gabapentin (NEURONTIN) 100 MG capsule Take 1 capsule (100 mg total) by mouth 2 (two) times daily. 02/15/15   Patel, Roxana Hires K, DO  ibuprofen (ADVIL,MOTRIN) 200 MG tablet Take 200 mg by mouth every 6 (six) hours as needed.    [provider]  polyethylene glycol (MIRALAX / GLYCOLAX) 17 g packet Take 17 g by mouth daily. 08/26/18   Loren Racer, MD  rosuvastatin (CRESTOR) 10 MG tablet TAKE ONE TABLET (10 MG DOSE) BY MOUTH AT  BEDTIME. 04/28/18   [provider]      Allergies    Morphine and codeine    Review of Systems   Review of Systems  Skin:        Skin itching  All other systems reviewed and are negative.   Physical Exam Updated Vital Signs BP 122/88 (BP Location: Right Arm)   Pulse 67   Temp 98.2 F (36.8 C) (Oral)   Resp 16   Ht 5\' 7"  (1.702 m)   Wt 79.4 kg   LMP 05/02/2015 Comment: bilat oophorectomies//a.c.  SpO2 100%   BMI 27.41 kg/m  Physical Exam Vitals and nursing note reviewed.  Constitutional:      General: She is not in acute distress.    Appearance: She is well-developed.  HENT:     Head: Normocephalic and atraumatic.  Eyes:     Conjunctiva/sclera: Conjunctivae normal.  Cardiovascular:     Rate and Rhythm: Normal rate and regular rhythm.     Heart sounds: No murmur heard. Pulmonary:     Effort: Pulmonary effort is normal. No respiratory distress.     Breath sounds: Normal breath sounds.  Abdominal:     Palpations: Abdomen is soft.     Tenderness: There is no abdominal tenderness.  Musculoskeletal:        General: No swelling.     Cervical back: Neck supple.  Skin:    General: Skin is warm and  dry.     Capillary Refill: Capillary refill takes less than 2 seconds.     Findings: Rash present.     Comments: Some swelling and redness to skin of the face as well as chest, but no significant urticaria.  Neurological:     Mental Status: She is alert.  Psychiatric:        Mood and Affect: Mood normal.     ED Results / Procedures / Treatments   Labs (all labs ordered are listed, but only abnormal results are displayed) Labs Reviewed - No data to display  EKG None  Radiology No results found.  Procedures Procedures   Medications Ordered in ED Medications  methylPREDNISolone acetate (DEPO-MEDROL) injection 80 mg (80 mg Intramuscular Given 02/23/23 1708)  famotidine (PEPCID) tablet 40 mg (40 mg Oral Given 02/23/23 1705)  loratadine (CLARITIN) tablet  10 mg (10 mg Oral Given 02/23/23 1706)    ED Course/ Medical Decision Making/ A&P                               Medical Decision Making Risk OTC drugs. Prescription drug management.   This patient presents to the ED for concern of allergic reaction. Differential diagnosis includes allergic reaction, anaphylaxis, medication side effect   Medicines ordered and prescription drug management:  I ordered medication including Depo-Medrol, Claritin, Pepcid for allergic reaction Reevaluation of the patient after these medicines showed that the patient improved I have reviewed the patients home medicines and have made adjustments as needed   Problem List / ED Course:  Patient presents to the emergency department concerns of an allergic reaction.  Reports that she began noticed some facial swelling and itchiness around 11 PM last night.  Unsure what exposure may have caused the symptoms.  No known significant allergens although she has had prior reaction similar to this.  Tried taking Benadryl around 4 AM without improvement in symptoms.  Denies any chest pain, chest tightness, shortness of breath at this time.  No difficulty swallowing or inability to tolerate oral intake. On exam, there is some mild facial swelling and some very mild erythema in the skin on her chest although not significantly diffuse and no significant urticarial rash present.  Will treat with a course of IM steroids, famotidine, and Claritin.  Will place patient on observation for a brief period of time as well. After about 2 hours of observation, patient does appear to have some improvement in symptoms.  No longer having itching skin.  Suspect mild allergic reaction.  Does not appear the patient was at any point in anaphylaxis.  Advised patient to avoid any possible triggers of the reaction encourage patient to follow-up with primary care provider.  Also encourage patient to continue course of steroids over the next several days  as well as addition of antihistamine medication such as Claritin and Pepcid.  Patient in agreement with plan and verbalized understanding return precautions.  Patient discharged home in stable condition.  Final Clinical Impression(s) / ED Diagnoses Final diagnoses:  Allergic reaction, initial encounter    Rx / DC Orders ED Discharge Orders          Ordered    predniSONE (DELTASONE) 10 MG tablet  Daily        02/23/23 1815              Smitty Knudsen, PA-C 02/23/23 2220    Royanne Foots, DO 03/03/23 1040

## 2023-02-23 NOTE — ED Notes (Signed)
Pt alert and oriented X 4 at the time of discharge. RR even and unlabored. No acute distress noted. Pt verbalized understanding of discharge instructions as discussed. Pt ambulatory to lobby at time of discharge.

## 2023-12-07 ENCOUNTER — Encounter (HOSPITAL_BASED_OUTPATIENT_CLINIC_OR_DEPARTMENT_OTHER): Payer: Self-pay

## 2023-12-07 ENCOUNTER — Other Ambulatory Visit (HOSPITAL_BASED_OUTPATIENT_CLINIC_OR_DEPARTMENT_OTHER): Payer: Self-pay

## 2023-12-07 ENCOUNTER — Emergency Department (HOSPITAL_BASED_OUTPATIENT_CLINIC_OR_DEPARTMENT_OTHER)
Admission: EM | Admit: 2023-12-07 | Discharge: 2023-12-07 | Disposition: A | Discharge status: Home / Self Care | Payer: Self-pay | Attending: Emergency Medicine | Admitting: Emergency Medicine

## 2023-12-07 ENCOUNTER — Other Ambulatory Visit: Payer: Self-pay

## 2023-12-07 ENCOUNTER — Emergency Department (HOSPITAL_BASED_OUTPATIENT_CLINIC_OR_DEPARTMENT_OTHER): Payer: Self-pay

## 2023-12-07 DIAGNOSIS — M25512 Pain in left shoulder: Secondary | ICD-10-CM | POA: Insufficient documentation

## 2023-12-07 DIAGNOSIS — M62838 Other muscle spasm: Secondary | ICD-10-CM | POA: Insufficient documentation

## 2023-12-07 LAB — BASIC METABOLIC PANEL WITH GFR
Anion gap: 11 (ref 5–15)
BUN: 10 mg/dL (ref 6–20)
CO2: 24 mmol/L (ref 22–32)
Calcium: 10.1 mg/dL (ref 8.9–10.3)
Chloride: 104 mmol/L (ref 98–111)
Creatinine, Ser: 0.64 mg/dL (ref 0.44–1.00)
GFR, Estimated: >60 mL/min (ref >60)
Glucose, Bld: 110 mg/dL — ABNORMAL HIGH (ref 70–99)
Potassium: 4.1 mmol/L (ref 3.5–5.1)
Sodium: 138 mmol/L (ref 135–145)

## 2023-12-07 LAB — CBC
HCT: 45 % (ref 36.0–46.0)
Hemoglobin: 15.2 g/dL — ABNORMAL HIGH (ref 12.0–15.0)
MCH: 30.2 pg (ref 26.0–34.0)
MCHC: 33.8 g/dL (ref 30.0–36.0)
MCV: 89.5 fL (ref 80.0–100.0)
Platelets: 320 K/uL (ref 150–400)
RBC: 5.03 MIL/uL (ref 3.87–5.11)
RDW: 13.5 % (ref 11.5–15.5)
WBC: 9.9 K/uL (ref 4.0–10.5)
nRBC: 0 % (ref 0.0–0.2)

## 2023-12-07 LAB — TROPONIN T, HIGH SENSITIVITY: Troponin T High Sensitivity: <15 ng/L (ref 0–19)

## 2023-12-07 MED ORDER — METAXALONE 800 MG PO TABS
800.0000 mg | ORAL_TABLET | Freq: Three times a day (TID) | ORAL | 0 refills | Status: AC | PRN
  Filled 2023-12-07: qty 21, 7d supply, fill #0

## 2023-12-07 MED ORDER — KETOROLAC TROMETHAMINE 30 MG/ML IJ SOLN
30.0000 mg | Freq: Once | INTRAMUSCULAR | Status: AC
  Administered 2023-12-07: 30 mg via INTRAMUSCULAR
  Filled 2023-12-07: qty 1

## 2023-12-07 MED ORDER — PREDNISONE 10 MG PO TABS
ORAL_TABLET | ORAL | 0 refills | Status: AC
  Filled 2023-12-07: qty 42, 12d supply, fill #0

## 2023-12-07 MED ORDER — METHYLPREDNISOLONE SODIUM SUCC 125 MG IJ SOLR
60.0000 mg | Freq: Once | INTRAMUSCULAR | Status: AC
  Administered 2023-12-07: 60 mg via INTRAMUSCULAR
  Filled 2023-12-07: qty 2

## 2023-12-07 NOTE — ED Provider Notes (Signed)
 [facility] Provider Note   CSN: [id number] Arrival date & time: 12/07/23  1106     Patient presents with: Shoulder Pain   Alexandria Herrera is a 53 y.o. female.    Shoulder Pain   53 year old female presents emergency department admits of left neck/shoulder pain.  States that she has had her current symptoms constantly for the past 4 days or so.  Works in [employer] research officer, government.  States that she had similar symptoms 20-ish days ago that seem to resolve after a week or so.  Went to outpatient provider couple days ago was prescribed Motrin  as well as Flexeril  which she states has not been helping.  Denies any weakness or sensory deficits in hand, chest pain, shortness of breath, fever, chills, cough.  Denies any exertional worsening of symptoms.  States that certain positions seem to worsen as well as using her left arm/shoulder.  Denies any falls/traumatic injuries.  Past medical history significant for hyperlipidemia, ovarian cyst, GERD, uterine fibroid  Prior to Admission medications   Medication Sig Start Date End Date Taking? Authorizing Provider  metaxalone  (SKELAXIN ) 800 MG tablet Take 1 tablet (800 mg total) by mouth every 8 (eight) hours as needed for muscle spasms. 12/07/23  Yes Silver Fell A, PA  predniSONE  (DELTASONE ) 10 MG tablet Take 6 tablets (60 mg total) by mouth daily for 2 days, THEN 5 tablets (50 mg total) daily for 2 days, THEN 4 tablets (40 mg total) daily for 2 days, THEN 3 tablets (30 mg total) daily for 2 days, THEN 2 tablets (20 mg total) daily for 2 days, THEN 1 tablet (10 mg total) daily for 2 days. 12/07/23 12/19/23 Yes Silver Fell A, PA  dicyclomine  (BENTYL ) 20 MG tablet Take 1 tablet (20 mg total) by mouth 2 (two) times daily as needed for spasms. 08/26/18   Carlyle Lenis, MD  fluticasone (FLONASE) 50 MCG/ACT nasal spray USE 1 SQUIRT TO EACH NOSTRIL DAILY AS NEEDED FOR SINUS SYMPTOMS 07/02/18   [provider]   gabapentin  (NEURONTIN ) 100 MG capsule Take 1 capsule (100 mg total) by mouth 2 (two) times daily. 02/15/15   Patel, Donika K, DO  ibuprofen  (ADVIL ,MOTRIN ) 200 MG tablet Take 200 mg by mouth every 6 (six) hours as needed.    [provider]  polyethylene glycol (MIRALAX  / GLYCOLAX ) 17 g packet Take 17 g by mouth daily. 08/26/18   Carlyle Lenis, MD  rosuvastatin (CRESTOR) 10 MG tablet TAKE ONE TABLET (10 MG DOSE) BY MOUTH AT BEDTIME. 04/28/18   [provider]    Allergies: Morphine  and codeine    Review of Systems  All other systems reviewed and are negative.   Updated Vital Signs BP (!) 158/99   Pulse 82   Temp 98.1 F (36.7 C) (Oral)   Resp 16   Wt 81.6 kg   LMP 05/02/2015 Comment: bilat oophorectomies//a.c.  SpO2 97%   BMI 28.19 kg/m   Physical Exam Vitals and nursing note reviewed.  Constitutional:      General: She is not in acute distress.    Appearance: She is well-developed.  HENT:     Head: Normocephalic and atraumatic.  Eyes:     Conjunctiva/sclera: Conjunctivae normal.  Cardiovascular:     Rate and Rhythm: Normal rate and regular rhythm.     Heart sounds: No murmur heard. Pulmonary:     Effort: Pulmonary effort is normal. No respiratory distress.     Breath sounds: Normal breath sounds.  Abdominal:     Palpations: Abdomen is soft.     Tenderness: There is no abdominal tenderness.  Musculoskeletal:        General: No swelling.     Cervical back: Neck supple.     Comments: No midline tenderness cervical spine.  Paraspinal tenderness left cervical spine with tenderness along left trapezial ridge.  Muscular strength 5 out of 5 bilateral upper extremities.  No sensory deficits along major nerve distributions of upper extremities.  Symmetric pulses bilateral upper lower extremities 2+.  Skin:    General: Skin is warm and dry.     Capillary Refill: Capillary refill takes less than 2 seconds.  Neurological:     Mental Status: She is alert.   Psychiatric:        Mood and Affect: Mood normal.     (all labs ordered are listed, but only abnormal results are displayed) Labs Reviewed  BASIC METABOLIC PANEL WITH GFR - Abnormal; Notable for the following components:      Result Value   Glucose, Bld 110 (*)    All other components within normal limits  CBC - Abnormal; Notable for the following components:   Hemoglobin 15.2 (*)    All other components within normal limits  TROPONIN T, HIGH SENSITIVITY  TROPONIN T, HIGH SENSITIVITY    EKG: None  Radiology: DG Shoulder Left Result Date: 12/07/2023 CLINICAL DATA:  Left neck, shoulder and arm pain for the past 3 days. EXAM: LEFT SHOULDER - 2+ VIEW COMPARISON:  None Available. FINDINGS: There is no evidence of fracture or dislocation. There is no evidence of arthropathy or other focal bone abnormality. Soft tissues are unremarkable. IMPRESSION: Negative. Electronically Signed   By: Elspeth Bathe M.D.   On: 12/07/2023 12:04   DG Chest 2 View Result Date: 12/07/2023 CLINICAL DATA:  Left neck, shoulder and arm pain for the past 3 days. EXAM: CHEST - 2 VIEW COMPARISON:  09/05/2017 FINDINGS: The heart size and mediastinal contours are within normal limits. Both lungs are clear. The visualized skeletal structures are unremarkable. IMPRESSION: No active cardiopulmonary disease. Electronically Signed   By: Elspeth Bathe M.D.   On: 12/07/2023 12:03     Procedures   Medications Ordered in the ED  ketorolac  (TORADOL ) 30 MG/ML injection 30 mg (has no administration in time range)  methylPREDNISolone  sodium succinate (SOLU-MEDROL ) 125 mg/2 mL injection 60 mg (has no administration in time range)                                    Medical Decision Making Amount and/or Complexity of Data Reviewed Labs: ordered. Radiology: ordered.  Risk Prescription drug management.   This patient presents to the ED for concern of shoulder pain, this involves an extensive number of treatment options,  and is a complaint that carries with it a high risk of complications and morbidity.  The differential diagnosis includes fracture, strain/sprain, ACS, PE, pneumothorax, arterial dissection, cervical radiculopathy, muscular strain/sprain, fracture, dislocation, other   Co morbidities that complicate the patient evaluation  See HPI   Additional history obtained:  Additional history obtained from EMR External records from outside source obtained and reviewed including hospital records   Lab Tests:  I Ordered, and personally interpreted labs.  The pertinent results include: No leukocytosis.  Polycythemia hemoglobin of 15.2.  Platelets within range.  No Electra abnormalities.  No renal dysfunction.  Troponin less than 15.  Imaging Studies ordered:  I ordered imaging studies including left shoulder x-ray, chest x-ray I independently visualized and interpreted imaging which showed  Left shoulder x-ray: No acute osseous abnormality Chest x-ray: No acute cardiopulmonary abnormality. I agree with the radiologist interpretation   Cardiac Monitoring: / EKG:  The patient was maintained on a cardiac monitor.  I personally viewed and interpreted the cardiac monitored which showed an underlying rhythm of: Sinus rhythm   Consultations Obtained:  N/a   Problem List / ED Course / Critical interventions / Medication management  Left shoulder pain I ordered medication including Toradol , Solu-Medrol    Reevaluation of the patient after these medicines showed that the patient improved I have reviewed the patients home medicines and have made adjustments as needed   Social Determinants of Health:  Denies tobacco,'s or drug use.   Test / Admission - Considered:  Left shoulder pain Vitals signs significant for hypertension. Otherwise within normal range and stable throughout visit. Laboratory/imaging studies significant for: see above  53 year old female presents emergency department  admits of left neck/shoulder pain.  States that she has had her current symptoms constantly for the past 4 days or so.  Works in [employer] research officer, government.  States that she had similar symptoms 20-ish days ago that seem to resolve after a week or so.  Went to outpatient provider couple days ago was prescribed Motrin  as well as Flexeril  which she states has not been helping.  Denies any weakness or sensory deficits in hand, chest pain, shortness of breath, fever, chills, cough.  Denies any exertional worsening of symptoms.  States that certain positions seem to worsen as well as using her left arm/shoulder.  Denies any falls/traumatic injuries. On exam, left paraspinal tenderness with extension down left trapezial ridge with obvious spasming compared to the lateral side.  No obvious weakness or sensory deficits bilateral upper extremities.  No pulse deficits.  Patient's workup today reassuring.  Negative troponin, lack of acute ischemic change on EKG; low suspicion for ACS.  Second troponin deemed unnecessary given patient's symptoms present persistently for the past 4 days.  Patient without chest pain rating her back, pulse deficits, neurodeficits, significant hypertension; low suspicion for arterial dissection.  No obvious weakness or sensory deficits bilateral upper extremities so no urgent need for MRI imaging of cervical spine.  Chest x-ray did not show obvious pneumothorax, pneumonia or other acute cardiopulmonary abnormality.  Suspect cervical radiculopathy versus muscular strain/spasming as causing the symptoms.  Will trial corticosteroid, recommend further symptomatic therapy as an AVS and close follow-up with the primary care provider in the outpatient setting.  Treatment plan discussed with patient treatment understanding was agreeable.  Patient well-appearing, afebrile in no acute distress upon discharge. Worrisome signs and symptoms were discussed with the patient, and the patient acknowledged understanding to  return to the ED if noticed. Patient was stable upon discharge.       Final diagnoses:  None    ED Discharge Orders          Ordered    predniSONE  (DELTASONE ) 10 MG tablet  Daily        12/07/23 1418    metaxalone  (SKELAXIN ) 800 MG tablet  Every 8 hours PRN        12/07/23 1418               Silver Wonda LABOR, [place] 12/07/23 1428    Elnor Savant A, DO 12/13/23 0000

## 2023-12-07 NOTE — ED Triage Notes (Addendum)
 Left neck and shoulder pain x 3 days. Pain extends to left elbow. No injury. Sharp pain in neck and radiates down. Pain had occurred 20 days ago and resolved

## 2023-12-07 NOTE — ED Notes (Signed)
 Dc instructions given, pt verbalized understanding. Ice pack given for L arm pain. Out of ED with steady gait, all belongings and not in visible distress. Rx sent to pt preferred pharmacy.

## 2023-12-07 NOTE — Discharge Instructions (Signed)
 As discussed, your workup today was reassuring.  I suspect that your pain is either coming from nerve impingement inside the neck or from muscular spasms beside we will treat your symptoms with a steroid taper as well as a different muscular relaxer.  Recommend close follow-up with your primary care for reassessment.  See information attached to your discharge papers regarding stretching exercises at home.
